# Patient Record
Sex: Female | Born: 1935
Health system: Southern US, Community
[De-identification: ages and names within clinical notes are randomized; demographics above are authoritative.]

## PROBLEM LIST (undated history)

## (undated) HISTORY — PX: ABDOMINAL HYSTERECTOMY: SHX81

## (undated) HISTORY — PX: BACK SURGERY: SHX140

---

## 1998-11-04 ENCOUNTER — Encounter: Admission: RE | Admit: 1998-11-04 | Discharge: 1999-02-02 | Payer: Self-pay | Admitting: Specialist

## 1999-02-19 ENCOUNTER — Other Ambulatory Visit: Admission: RE | Admit: 1999-02-19 | Discharge: 1999-02-19 | Payer: Self-pay | Admitting: Family Medicine

## 2000-02-27 ENCOUNTER — Encounter: Payer: Self-pay | Admitting: Family Medicine

## 2000-02-27 ENCOUNTER — Encounter: Admission: RE | Admit: 2000-02-27 | Discharge: 2000-02-27 | Payer: Self-pay | Admitting: Family Medicine

## 2000-06-11 ENCOUNTER — Emergency Department (HOSPITAL_COMMUNITY): Admission: EM | Admit: 2000-06-11 | Discharge: 2000-06-11 | Payer: Self-pay | Admitting: Emergency Medicine

## 2001-02-23 ENCOUNTER — Encounter: Payer: Self-pay | Admitting: Neurosurgery

## 2001-02-23 ENCOUNTER — Encounter: Admission: RE | Admit: 2001-02-23 | Discharge: 2001-02-23 | Payer: Self-pay | Admitting: Neurosurgery

## 2001-03-04 ENCOUNTER — Encounter: Payer: Self-pay | Admitting: Family Medicine

## 2001-03-04 ENCOUNTER — Encounter: Admission: RE | Admit: 2001-03-04 | Discharge: 2001-03-04 | Payer: Self-pay | Admitting: Family Medicine

## 2002-03-06 ENCOUNTER — Encounter: Admission: RE | Admit: 2002-03-06 | Discharge: 2002-03-06 | Payer: Self-pay | Admitting: Family Medicine

## 2002-03-06 ENCOUNTER — Encounter: Payer: Self-pay | Admitting: Family Medicine

## 2003-03-15 ENCOUNTER — Encounter: Payer: Self-pay | Admitting: Family Medicine

## 2003-03-15 ENCOUNTER — Encounter: Admission: RE | Admit: 2003-03-15 | Discharge: 2003-03-15 | Payer: Self-pay | Admitting: Family Medicine

## 2004-03-18 ENCOUNTER — Encounter: Admission: RE | Admit: 2004-03-18 | Discharge: 2004-03-18 | Payer: Self-pay | Admitting: Family Medicine

## 2005-03-19 ENCOUNTER — Encounter: Admission: RE | Admit: 2005-03-19 | Discharge: 2005-03-19 | Payer: Self-pay | Admitting: Family Medicine

## 2006-03-31 ENCOUNTER — Encounter: Admission: RE | Admit: 2006-03-31 | Discharge: 2006-03-31 | Payer: Self-pay | Admitting: Family Medicine

## 2007-04-04 ENCOUNTER — Encounter: Admission: RE | Admit: 2007-04-04 | Discharge: 2007-04-04 | Payer: Self-pay | Admitting: Family Medicine

## 2007-12-27 ENCOUNTER — Ambulatory Visit: Payer: Self-pay | Admitting: Family Medicine

## 2007-12-27 ENCOUNTER — Encounter: Admission: RE | Admit: 2007-12-27 | Discharge: 2007-12-27 | Payer: Self-pay | Admitting: Family Medicine

## 2007-12-30 ENCOUNTER — Ambulatory Visit: Payer: Self-pay | Admitting: Family Medicine

## 2008-01-13 ENCOUNTER — Ambulatory Visit: Payer: Self-pay | Admitting: Family Medicine

## 2008-01-14 ENCOUNTER — Ambulatory Visit (HOSPITAL_COMMUNITY): Admission: RE | Admit: 2008-01-14 | Discharge: 2008-01-14 | Payer: Self-pay | Admitting: Family Medicine

## 2008-04-04 ENCOUNTER — Encounter: Admission: RE | Admit: 2008-04-04 | Discharge: 2008-04-04 | Payer: Self-pay | Admitting: Family Medicine

## 2009-06-19 ENCOUNTER — Encounter: Admission: RE | Admit: 2009-06-19 | Discharge: 2009-06-19 | Payer: Self-pay | Admitting: Orthopaedic Surgery

## 2010-07-12 ENCOUNTER — Observation Stay (HOSPITAL_COMMUNITY): Admission: EM | Admit: 2010-07-12 | Discharge: 2010-07-13 | Payer: Self-pay | Admitting: Emergency Medicine

## 2010-07-29 ENCOUNTER — Ambulatory Visit: Payer: Self-pay | Admitting: Family Medicine

## 2011-01-08 LAB — RAPID URINE DRUG SCREEN, HOSP PERFORMED
Amphetamines: NOT DETECTED
Barbiturates: NOT DETECTED
Benzodiazepines: NOT DETECTED
Cocaine: NOT DETECTED
Opiates: NOT DETECTED
Tetrahydrocannabinol: NOT DETECTED

## 2011-01-08 LAB — COMPREHENSIVE METABOLIC PANEL
ALT: 17 U/L (ref 0–35)
AST: 23 U/L (ref 0–37)
Albumin: 4 g/dL (ref 3.5–5.2)
Alkaline Phosphatase: 84 U/L (ref 39–117)
BUN: 8 mg/dL (ref 6–23)
CO2: 26 mEq/L (ref 19–32)
Calcium: 9.4 mg/dL (ref 8.4–10.5)
Chloride: 109 mEq/L (ref 96–112)
Creatinine, Ser: 0.91 mg/dL (ref 0.4–1.2)
GFR calc Af Amer: >60 mL/min (ref >60)
GFR calc non Af Amer: >60 mL/min (ref >60)
Glucose, Bld: 95 mg/dL (ref 70–99)
Potassium: 4 mEq/L (ref 3.5–5.1)
Sodium: 140 mEq/L (ref 135–145)
Total Bilirubin: 0.4 mg/dL (ref 0.3–1.2)
Total Protein: 7.5 g/dL (ref 6.0–8.3)

## 2011-01-08 LAB — URINALYSIS, ROUTINE W REFLEX MICROSCOPIC
Bilirubin Urine: NEGATIVE
Glucose, UA: NEGATIVE mg/dL
Hgb urine dipstick: NEGATIVE
Ketones, ur: NEGATIVE mg/dL
Nitrite: NEGATIVE
Protein, ur: NEGATIVE mg/dL
Specific Gravity, Urine: 1.005 (ref 1.005–1.030)
Urobilinogen, UA: 0.2 mg/dL (ref 0.0–1.0)
pH: 7 (ref 5.0–8.0)

## 2011-01-08 LAB — CBC
HCT: 39.5 % (ref 36.0–46.0)
Hemoglobin: 13 g/dL (ref 12.0–15.0)
MCH: 27.5 pg (ref 26.0–34.0)
MCHC: 32.9 g/dL (ref 30.0–36.0)
MCV: 83.5 fL (ref 78.0–100.0)
Platelets: 261 10*3/uL (ref 150–400)
RBC: 4.73 MIL/uL (ref 3.87–5.11)
RDW: 14.8 % (ref 11.5–15.5)
WBC: 6.1 10*3/uL (ref 4.0–10.5)

## 2011-01-08 LAB — POCT CARDIAC MARKERS
CKMB, poc: <1 ng/mL — ABNORMAL LOW (ref 1.0–8.0)
Myoglobin, poc: 68.9 ng/mL (ref 12–200)
Troponin i, poc: <0.05 ng/mL (ref 0.00–0.09)

## 2011-01-08 LAB — DIFFERENTIAL
Basophils Absolute: 0.1 10*3/uL (ref 0.0–0.1)
Basophils Relative: 1 % (ref 0–1)
Eosinophils Absolute: 0.3 10*3/uL (ref 0.0–0.7)
Eosinophils Relative: 5 % (ref 0–5)
Lymphocytes Relative: 23 % (ref 12–46)
Lymphs Abs: 1.4 10*3/uL (ref 0.7–4.0)
Monocytes Absolute: 0.8 10*3/uL (ref 0.1–1.0)
Monocytes Relative: 13 % — ABNORMAL HIGH (ref 3–12)
Neutro Abs: 3.6 10*3/uL (ref 1.7–7.7)
Neutrophils Relative %: 58 % (ref 43–77)

## 2011-01-08 LAB — ETHANOL: Alcohol, Ethyl (B): <5 mg/dL (ref 0–10)

## 2011-02-16 ENCOUNTER — Other Ambulatory Visit: Payer: Self-pay | Admitting: Neurosurgery

## 2011-02-16 DIAGNOSIS — M79603 Pain in arm, unspecified: Secondary | ICD-10-CM

## 2011-02-16 DIAGNOSIS — S143XXA Injury of brachial plexus, initial encounter: Secondary | ICD-10-CM

## 2011-02-19 ENCOUNTER — Ambulatory Visit
Admission: RE | Admit: 2011-02-19 | Discharge: 2011-02-19 | Disposition: A | Discharge status: Home / Self Care | Payer: Medicare Other | Source: Ambulatory Visit | Attending: Neurosurgery | Admitting: Neurosurgery

## 2011-02-19 DIAGNOSIS — S143XXA Injury of brachial plexus, initial encounter: Secondary | ICD-10-CM

## 2011-02-19 DIAGNOSIS — M79603 Pain in arm, unspecified: Secondary | ICD-10-CM

## 2011-02-19 MED ORDER — GADOBENATE DIMEGLUMINE 529 MG/ML IV SOLN
16.0000 mL | Freq: Once | INTRAVENOUS | Status: AC | PRN
  Administered 2011-02-19: 16 mL via INTRAVENOUS

## 2012-01-18 ENCOUNTER — Ambulatory Visit (INDEPENDENT_AMBULATORY_CARE_PROVIDER_SITE_OTHER): Payer: Medicare Other | Admitting: Family Medicine

## 2012-01-18 ENCOUNTER — Encounter: Payer: Self-pay | Admitting: Family Medicine

## 2012-01-18 VITALS — BP 120/78 | HR 87 | Ht 65.0 in | Wt 184.0 lb

## 2012-01-18 DIAGNOSIS — Z79899 Other long term (current) drug therapy: Secondary | ICD-10-CM

## 2012-01-18 DIAGNOSIS — Z23 Encounter for immunization: Secondary | ICD-10-CM

## 2012-01-18 DIAGNOSIS — F439 Reaction to severe stress, unspecified: Secondary | ICD-10-CM

## 2012-01-18 DIAGNOSIS — Z299 Encounter for prophylactic measures, unspecified: Secondary | ICD-10-CM

## 2012-01-18 DIAGNOSIS — Z639 Problem related to primary support group, unspecified: Secondary | ICD-10-CM

## 2012-01-18 DIAGNOSIS — Z1231 Encounter for screening mammogram for malignant neoplasm of breast: Secondary | ICD-10-CM

## 2012-01-18 NOTE — Progress Notes (Signed)
  Subjective:    Patient ID: Katherine Mccormick, female    DOB: [birth year], 78 y.o.   MRN: [id number]  HPI She is here for consultation. Her main concern today is the stress that she is under taking care of her husband. He does have diabetes and has had extensive difficulty with this including partial amputation of the leg. Apparently he is relying upon her for taking care of all of his needs. This has gotten her quite upset and to a certain extent resentful. Presently she is on no medicines. Review of her record indicates she does need routine medical followup however she is really not necessarily interested in having this.   Review of Systems     Objective:   Physical Exam alert and in no distress. Tympanic membranes and canals are normal. Throat is clear. Tonsils are normal. Neck is supple without adenopathy or thyromegaly. Cardiac exam shows a regular sinus rhythm without murmurs or gallops. Lungs are clear to auscultation.       Assessment & Plan:   1. Encounter for long-term (current) use of other medications  DG Bone Density  2. Preventive measure  MM Digital Screening  3. Stress At Home     I did talk her into bone density scan and mammogram. Discussed getting counseling or even having home health come in. She would like to discuss this further with her children. Over 30 minutes spent discussing all these issues with her.

## 2012-01-28 ENCOUNTER — Ambulatory Visit
Admission: RE | Admit: 2012-01-28 | Discharge: 2012-01-28 | Disposition: A | Discharge status: Home / Self Care | Payer: Medicare Other | Source: Ambulatory Visit | Attending: Family Medicine | Admitting: Family Medicine

## 2012-01-28 DIAGNOSIS — Z299 Encounter for prophylactic measures, unspecified: Secondary | ICD-10-CM

## 2012-02-05 ENCOUNTER — Encounter: Payer: Self-pay | Admitting: Family Medicine

## 2012-02-21 ENCOUNTER — Observation Stay (HOSPITAL_COMMUNITY)
Admission: EM | Admit: 2012-02-21 | Discharge: 2012-02-22 | Disposition: A | Discharge status: Home / Self Care | Payer: No Typology Code available for payment source | Attending: Internal Medicine | Admitting: Internal Medicine

## 2012-02-21 ENCOUNTER — Emergency Department (HOSPITAL_COMMUNITY): Payer: No Typology Code available for payment source

## 2012-02-21 ENCOUNTER — Encounter (HOSPITAL_COMMUNITY): Payer: Self-pay | Admitting: Emergency Medicine

## 2012-02-21 DIAGNOSIS — S060XAA Concussion with loss of consciousness status unknown, initial encounter: Secondary | ICD-10-CM | POA: Insufficient documentation

## 2012-02-21 DIAGNOSIS — R55 Syncope and collapse: Principal | ICD-10-CM

## 2012-02-21 DIAGNOSIS — M25571 Pain in right ankle and joints of right foot: Secondary | ICD-10-CM

## 2012-02-21 DIAGNOSIS — S9000XA Contusion of unspecified ankle, initial encounter: Secondary | ICD-10-CM | POA: Insufficient documentation

## 2012-02-21 DIAGNOSIS — S93409A Sprain of unspecified ligament of unspecified ankle, initial encounter: Secondary | ICD-10-CM | POA: Insufficient documentation

## 2012-02-21 DIAGNOSIS — S060X9A Concussion with loss of consciousness of unspecified duration, initial encounter: Secondary | ICD-10-CM | POA: Insufficient documentation

## 2012-02-21 LAB — TROPONIN I: Troponin I: <0.3 ng/mL (ref <0.30)

## 2012-02-21 LAB — CBC
HCT: 43.1 % (ref 36.0–46.0)
Hemoglobin: 14.5 g/dL (ref 12.0–15.0)
MCH: 28 pg (ref 26.0–34.0)
MCHC: 33.6 g/dL (ref 30.0–36.0)
MCV: 83.4 fL (ref 78.0–100.0)
Platelets: 251 10*3/uL (ref 150–400)
RBC: 5.17 MIL/uL — ABNORMAL HIGH (ref 3.87–5.11)
RDW: 14.7 % (ref 11.5–15.5)
WBC: 8.3 10*3/uL (ref 4.0–10.5)

## 2012-02-21 LAB — BASIC METABOLIC PANEL
BUN: 14 mg/dL (ref 6–23)
CO2: 28 mEq/L (ref 19–32)
Calcium: 10.2 mg/dL (ref 8.4–10.5)
Chloride: 103 mEq/L (ref 96–112)
Creatinine, Ser: 0.9 mg/dL (ref 0.50–1.10)
GFR calc Af Amer: 71 mL/min — ABNORMAL LOW (ref >90)
GFR calc non Af Amer: 61 mL/min — ABNORMAL LOW (ref >90)
Glucose, Bld: 122 mg/dL — ABNORMAL HIGH (ref 70–99)
Potassium: 3.7 mEq/L (ref 3.5–5.1)
Sodium: 143 mEq/L (ref 135–145)

## 2012-02-21 LAB — CARDIAC PANEL(CRET KIN+CKTOT+MB+TROPI)
CK, MB: 2.5 ng/mL (ref 0.3–4.0)
Relative Index: 1.2 (ref 0.0–2.5)
Total CK: 212 U/L — ABNORMAL HIGH (ref 7–177)
Troponin I: <0.3 ng/mL (ref <0.30)

## 2012-02-21 LAB — DIFFERENTIAL
Basophils Absolute: 0.1 10*3/uL (ref 0.0–0.1)
Basophils Relative: 1 % (ref 0–1)
Eosinophils Absolute: 0.1 10*3/uL (ref 0.0–0.7)
Eosinophils Relative: 1 % (ref 0–5)
Lymphocytes Relative: 15 % (ref 12–46)
Lymphs Abs: 1.3 10*3/uL (ref 0.7–4.0)
Monocytes Absolute: 0.7 10*3/uL (ref 0.1–1.0)
Monocytes Relative: 8 % (ref 3–12)
Neutro Abs: 6.2 10*3/uL (ref 1.7–7.7)
Neutrophils Relative %: 75 % (ref 43–77)

## 2012-02-21 MED ORDER — DICLOFENAC SODIUM 1 % TD GEL
Freq: Four times a day (QID) | TRANSDERMAL | Status: DC
  Administered 2012-02-22 (×2): via TOPICAL
  Filled 2012-02-21 (×3): qty 100

## 2012-02-21 MED ORDER — SODIUM CHLORIDE 0.9 % IV SOLN
INTRAVENOUS | Status: DC
  Administered 2012-02-21: 21:00:00 via INTRAVENOUS

## 2012-02-21 MED ORDER — ACETAMINOPHEN 650 MG RE SUPP: 650.0000 mg | Freq: Four times a day (QID) | RECTAL | Status: DC | PRN

## 2012-02-21 MED ORDER — ACETAMINOPHEN 325 MG PO TABS: 650.0000 mg | ORAL_TABLET | Freq: Four times a day (QID) | ORAL | Status: DC | PRN

## 2012-02-21 MED ORDER — ONDANSETRON HCL 4 MG PO TABS: 4.0000 mg | ORAL_TABLET | Freq: Four times a day (QID) | ORAL | Status: DC | PRN

## 2012-02-21 MED ORDER — ASPIRIN 325 MG PO TABS
325.0000 mg | ORAL_TABLET | Freq: Every day | ORAL | Status: DC | PRN
  Filled 2012-02-21: qty 1

## 2012-02-21 MED ORDER — ONDANSETRON HCL 4 MG/2ML IJ SOLN: 4.0000 mg | Freq: Four times a day (QID) | INTRAMUSCULAR | Status: DC | PRN

## 2012-02-21 MED ORDER — VITAMIN C 500 MG PO TABS
500.0000 mg | ORAL_TABLET | Freq: Every day | ORAL | Status: DC
  Administered 2012-02-22: 500 mg via ORAL
  Filled 2012-02-21 (×2): qty 1
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
## 2012-02-21 NOTE — ED Notes (Signed)
Report given to Melissa H., RN.
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
## 2012-02-22 DIAGNOSIS — R55 Syncope and collapse: Secondary | ICD-10-CM

## 2012-02-22 LAB — CARDIAC PANEL(CRET KIN+CKTOT+MB+TROPI)
CK, MB: 2.2 ng/mL (ref 0.3–4.0)
CK, MB: 1.9 ng/mL (ref 0.3–4.0)
Relative Index: 1.2 (ref 0.0–2.5)
Relative Index: 1.1 (ref 0.0–2.5)
Total CK: 180 U/L — ABNORMAL HIGH (ref 7–177)
Total CK: 166 U/L (ref 7–177)
Troponin I: <0.3 ng/mL (ref <0.30)
Troponin I: <0.3 ng/mL (ref <0.30)

## 2012-02-22 LAB — BASIC METABOLIC PANEL
BUN: 14 mg/dL (ref 6–23)
CO2: 24 mEq/L (ref 19–32)
Calcium: 9.5 mg/dL (ref 8.4–10.5)
Chloride: 108 mEq/L (ref 96–112)
Creatinine, Ser: 0.86 mg/dL (ref 0.50–1.10)
GFR calc Af Amer: 75 mL/min — ABNORMAL LOW (ref >90)
GFR calc non Af Amer: 64 mL/min — ABNORMAL LOW (ref >90)
Glucose, Bld: 100 mg/dL — ABNORMAL HIGH (ref 70–99)
Potassium: 3.8 mEq/L (ref 3.5–5.1)
Sodium: 144 mEq/L (ref 135–145)

## 2012-02-22 MED ORDER — ACETAMINOPHEN 325 MG PO TABS: 650.0000 mg | ORAL_TABLET | Freq: Four times a day (QID) | ORAL | Status: AC | PRN

## 2012-02-22 MED ORDER — DICLOFENAC SODIUM 1 % TD GEL: 1.0000 "application " | Freq: Four times a day (QID) | TRANSDERMAL | Status: DC
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
## 2012-02-22 NOTE — Progress Notes (Signed)
Bilateral carotid artery duplex completed at 09:12.  Preliminary report is no evidence of significant ICA stenosis.

## 2012-02-22 NOTE — Discharge Instructions (Signed)
Apply ice every 2 to 3 hour to ankle for 24 hour more.  If headache get worse return to hospital.  Please follow with PCP in 2 days.  Do not drive.

## 2012-02-22 NOTE — Progress Notes (Signed)
*  PRELIMINARY RESULTS* Echocardiogram 2D Echocardiogram has been performed.  Katherine Mccormick 02/22/2012, 12:23 PM
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
## 2012-02-25 ENCOUNTER — Ambulatory Visit (INDEPENDENT_AMBULATORY_CARE_PROVIDER_SITE_OTHER): Payer: Medicare Other | Admitting: Family Medicine

## 2012-02-25 ENCOUNTER — Encounter: Payer: Self-pay | Admitting: Family Medicine

## 2012-02-25 DIAGNOSIS — S9000XA Contusion of unspecified ankle, initial encounter: Secondary | ICD-10-CM

## 2012-02-25 DIAGNOSIS — S9001XA Contusion of right ankle, initial encounter: Secondary | ICD-10-CM
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
## 2012-02-25 NOTE — Patient Instructions (Signed)
Use the wrap as needed for comfort. You can take it off whenever you want. Tylenol for the aches and pains time will cure more than I would ever hope to!

## 2012-12-20 ENCOUNTER — Other Ambulatory Visit: Payer: Self-pay | Admitting: Family Medicine

## 2012-12-20 DIAGNOSIS — Z1231 Encounter for screening mammogram for malignant neoplasm of breast: Secondary | ICD-10-CM

## 2013-01-30 ENCOUNTER — Ambulatory Visit
Admission: RE | Admit: 2013-01-30 | Discharge: 2013-01-30 | Disposition: A | Discharge status: Home / Self Care | Payer: Medicare Other | Source: Ambulatory Visit | Attending: Family Medicine | Admitting: Family Medicine

## 2013-01-30 DIAGNOSIS — Z1231 Encounter for screening mammogram for malignant neoplasm of breast: Secondary | ICD-10-CM

## 2013-05-04 ENCOUNTER — Encounter: Payer: Self-pay | Admitting: Family Medicine

## 2013-05-04 ENCOUNTER — Ambulatory Visit (INDEPENDENT_AMBULATORY_CARE_PROVIDER_SITE_OTHER): Payer: Medicare Other | Admitting: Family Medicine

## 2013-05-04 VITALS — BP 120/80 | HR 77 | Temp 98.4°F | Wt 177.0 lb

## 2013-05-04 DIAGNOSIS — R109 Unspecified abdominal pain: Secondary | ICD-10-CM

## 2013-05-04 NOTE — Progress Notes (Signed)
  Subjective:    Patient ID: Katherine Mccormick, female    DOB: [birth year], 79 y.o.   MRN: [id number]  HPI She has a 2 to three-week history of difficulty with abdominal distress. She notes that getting upset does make it worse. She does complain of a cramping sensation. No nausea, vomiting, diarrhea. She states that food does make it feel better. No bowel habit changes or black, tarry stools   Review of Systems     Objective:   Physical Exam Alert and in no distress. Abdominal exam shows active bowel sounds without masses or tenderness       Assessment & Plan:  Abdominal  pain, other specified site  I explained that I did not think this was necessarily an ulcer but did have a psychological component to this as well. I will try her on Prilosec for the next 10 days or double dosing and she will then call me.

## 2013-05-04 NOTE — Patient Instructions (Signed)
Take 2 Prilosec per day for the next 7-10 days and let me know how you're doing
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
## 2014-03-13 ENCOUNTER — Other Ambulatory Visit: Payer: Self-pay

## 2014-03-13 DIAGNOSIS — Z1231 Encounter for screening mammogram for malignant neoplasm of breast: Secondary | ICD-10-CM

## 2014-04-03 ENCOUNTER — Encounter (INDEPENDENT_AMBULATORY_CARE_PROVIDER_SITE_OTHER): Payer: Self-pay

## 2014-04-03 ENCOUNTER — Ambulatory Visit
Admission: RE | Admit: 2014-04-03 | Discharge: 2014-04-03 | Disposition: A | Discharge status: Home / Self Care | Payer: Medicare Other | Source: Ambulatory Visit

## 2014-04-03 DIAGNOSIS — Z1231 Encounter for screening mammogram for malignant neoplasm of breast: Secondary | ICD-10-CM

## 2014-07-04 ENCOUNTER — Ambulatory Visit (INDEPENDENT_AMBULATORY_CARE_PROVIDER_SITE_OTHER): Payer: Medicare Other | Admitting: Family Medicine

## 2014-07-04 ENCOUNTER — Encounter: Payer: Self-pay | Admitting: Family Medicine

## 2014-07-04 VITALS — BP 124/82 | HR 89 | Wt 183.0 lb

## 2014-07-04 DIAGNOSIS — M79605 Pain in left leg: Secondary | ICD-10-CM

## 2014-07-04 DIAGNOSIS — M79609 Pain in unspecified limb: Secondary | ICD-10-CM

## 2014-07-04 NOTE — Progress Notes (Signed)
   Subjective:    Patient ID: Katherine Mccormick, female    DOB: 1934-02-13, 80 y.o.   MRN: [id number]  HPI She has a ten-day history of left lateral calf pain standing and walking make it worse. No history of injury or overuse. The pain starts in the lower extremity and then migrates proximal with standing and walking . No numbness, tingling or weakness  Review of Systems     Objective:   Physical Exam Full motion of the hip knees and ankles. Pulses are good. Slight tenderness to palpation over the anterior compartment. DTRs normal. Negative Homan's sign. No edema noted.      Assessment & Plan:  Leg pain, lateral, left  recommend conservative care with heat and anti-inflammatory. Return here symptoms continue.

## 2014-07-04 NOTE — Patient Instructions (Signed)
Heat for 20 minutes 3 times per day. 2 Aleve twice a day. Do this for about 10 days and let see how you

## 2014-08-08 ENCOUNTER — Encounter: Payer: Self-pay | Admitting: Family Medicine

## 2014-08-08 ENCOUNTER — Ambulatory Visit (INDEPENDENT_AMBULATORY_CARE_PROVIDER_SITE_OTHER): Payer: Medicare Other | Admitting: Family Medicine

## 2014-08-08 VITALS — BP 122/78 | HR 71 | Temp 97.9°F | Wt 180.0 lb

## 2014-08-08 DIAGNOSIS — M79662 Pain in left lower leg: Secondary | ICD-10-CM

## 2014-08-08 NOTE — Progress Notes (Signed)
   Subjective:    Patient ID: Katherine Mccormick, female    DOB: [birth year], 80 y.o.   MRN: [id number]  HPI She is here for evaluation of continued with left lateral calf pain. It is now constant and does get worse with any activity. She has no previous history of injury or overuse. No other bone or joint pain is noted. She's had no chest pain or shortness of breath, fever, chills.   Review of Systems     Objective:   Physical Exam Active motion of the ankle and knee cause discomfort. Passive motion of knee and ankle was negative. Tender to palpation to the lateral calf area. No palpable lesions were noted. Homans sign was negative. Skin appears normal.       Assessment & Plan:  Calf pain, left - Plan: DG Tibia/Fibula Left  if the x-ray is negative, I will send for ultrasound

## 2014-08-09 ENCOUNTER — Ambulatory Visit
Admission: RE | Admit: 2014-08-09 | Discharge: 2014-08-09 | Disposition: A | Discharge status: Home / Self Care | Payer: Medicare Other | Source: Ambulatory Visit | Attending: Family Medicine | Admitting: Family Medicine

## 2014-08-09 ENCOUNTER — Encounter (INDEPENDENT_AMBULATORY_CARE_PROVIDER_SITE_OTHER): Payer: Self-pay

## 2014-08-09 DIAGNOSIS — M79662 Pain in left lower leg: Secondary | ICD-10-CM

## 2014-08-14 ENCOUNTER — Encounter: Payer: Self-pay | Admitting: Family Medicine

## 2014-08-14 ENCOUNTER — Ambulatory Visit (INDEPENDENT_AMBULATORY_CARE_PROVIDER_SITE_OTHER): Payer: Medicare Other | Admitting: Family Medicine

## 2014-08-14 ENCOUNTER — Other Ambulatory Visit (INDEPENDENT_AMBULATORY_CARE_PROVIDER_SITE_OTHER): Payer: Medicare Other

## 2014-08-14 VITALS — BP 140/82 | HR 96 | Ht 65.5 in | Wt 183.0 lb

## 2014-08-14 DIAGNOSIS — M79605 Pain in left leg: Secondary | ICD-10-CM

## 2014-08-14 DIAGNOSIS — G5782 Other specified mononeuropathies of left lower limb: Secondary | ICD-10-CM

## 2014-08-14 MED ORDER — GABAPENTIN 100 MG PO CAPS
100.0000 mg | ORAL_CAPSULE | Freq: Every day | ORAL | Status: DC
Start: 2014-08-14 — End: 2017-08-19

## 2014-08-14 NOTE — Progress Notes (Signed)
Katherine ScaleZach Tamlyn Mccormick D.O. [facility] [address] [place], [place] [address] Phone: [phone] Subjective:    I'm seeing this patient by the request  of:  Katherine HerterLALONDE,JOHN CHARLES, MD   CC:  Left leg pain  HQI:[id number]:Subjective Katherine SakaiHelen G Mccormick is a 80 y.o. female coming in with complaint of left calf pain. Patient has been seen by primary care provider on 2 occasions over the course of the last 6 weeks for this pain. Patient states that the pain is worse when she is standing or walking for long amount of time. Denies any pain with sitting. Does not remember any true injury. Points the pain mostly over the lateral distal aspect of the calf muscle as well as the fibula bone itself. Pain going upper calf. Patient states that she does a significant amount walking he can even hurt on her lateral hip. Patient does have a past medical history significant for arthroscopic surgery of the knee but states that she does not have any knee pain or swelling a regular basis. Patient states that this pain can be more of a burning sensation as well and give her some numbness in her large toe from time to time. Rates the severity of 7/10. States that he can keep her up at night but does not wake her up at night.  Patient was sent for x-rays of the tibia-fibula which were unremarkable. These were reviewed by me today. Patient does have moderate to severe osteophytic changes of the knee though noted.       Past medical history, social, surgical and family history all reviewed in electronic medical record.   Review of Systems: No headache, visual changes, nausea, vomiting, diarrhea, constipation, dizziness, abdominal pain, skin rash, fevers, chills, night sweats, weight loss, swollen lymph nodes, body aches, joint swelling, muscle aches, chest pain, shortness of breath, mood changes.   Objective Blood pressure 140/82, pulse 96, height 5' 5.5" (1.664 m), weight 183 lb (83.008 kg), SpO2 96.00%.  General: No apparent  distress alert and oriented x3 mood and affect normal, dressed appropriately.  HEENT: Pupils equal, extraocular movements intact  Respiratory: Patient's speak in full sentences and does not appear short of breath  Cardiovascular: No lower extremity edema, non tender, no erythema  Skin: Warm dry intact with no signs of infection or rash on extremities or on axial skeleton.  Abdomen: Soft nontender  Neuro: Cranial nerves II through XII are intact, neurovascularly intact in all extremities with 2+ DTRs and 2+ pulses.  Lymph: No lymphadenopathy of posterior or anterior cervical chain or axillae bilaterally.  Gait mild antalgic gait and walks with a cane.  MSK:  Non tender with full range of motion and good stability and symmetric strength and tone of shoulders, elbows, wrist, hip and ankles bilaterally.   Knee: Left Nontender on exam the patient does have moderate osteophytic changes Patient though does have tenderness mostly over the lateral aspect the leg and seems to be superficial to the fibular bone on the proximal ankle area. Patient does have overlying dry skin in this area. ROM full in flexion and extension and lower leg rotation. Ligaments with solid consistent endpoints including ACL, PCL, LCL, MCL. Negative Mcmurray's, Apley's, and Thessalonian tests. Mild painful patellar compression. Patellar glide without crepitus. Patellar and quadriceps tendons unremarkable. Hamstring and quadriceps strength is normal.   MSK US performed of: Left calf This study was ordered, performed, and interpreted by Terrilee FilesZach Jodel Mayhall D.O.   Limited study shows the patient does have a  proximal strain with scarring over the insertion of the gastrocnemius on the lateral femoral condyle. Patient is minimal tender in this area. Popliteus muscle is intact and sitting within the groove with no signs of subluxation. In area where patient is most tender over the lateral aspect patient does have significant enlargement of  the sural nerve patient is tender to compression. Increasing Doppler flow noted.  IMPRESSION: Sural neuritis and questionable old calf tear      Impression and Recommendations:     This case required medical decision making of moderate complexity.

## 2014-08-14 NOTE — Patient Instructions (Addendum)
Good to meet you I see that your sural nerve is irritated.  Wear the ace wrap daily to give padding.  Don't sleep in it.  Ice 10 minutes 2 times daily can help Eucerin or aveeno cream 3 times daily to dry skin.  Gabapentin 100mg  at night. This will decrease the irritation Can continue the voltaren gel.  Vitamin D 2000 IU daily.  Exercises 3 times a week.  See me again in 2-3 weeks and bring some one with you if we decide to inject the nerve.

## 2014-08-14 NOTE — Assessment & Plan Note (Signed)
Discussed with patient at great length. We did discuss the significant difference treatment options. Patient was given a compression dressing today that did helped out significantly. We discussed avoiding direct contact in this area. Patient will also try to help with the dry skin over the course of time here. We discussed also continuing the Voltaren gel she was given by PCP.  We discussed an icing protocol and patient was given some exercises that I think will be beneficial. Patient will start gabapentin 100 mg at night to try to help her with sleep and try to decrease the sensation of this nerve. Patient will come back again in 2-3 weeks for further evaluation. If continuing to have discomfort we can consider a intraneuronal sheath injection the patient would need to bring someone in case foot drop that did occur for short amount of time. Hopefully that this will be needed. Differential also includes referred pain from her osteophytic knee but will discuss further at followup.

## 2014-08-28 ENCOUNTER — Ambulatory Visit: Payer: Medicare Other | Admitting: Family Medicine

## 2014-08-29 ENCOUNTER — Ambulatory Visit (INDEPENDENT_AMBULATORY_CARE_PROVIDER_SITE_OTHER): Payer: Medicare Other | Admitting: Family Medicine

## 2014-08-29 ENCOUNTER — Encounter: Payer: Self-pay | Admitting: Family Medicine

## 2014-08-29 VITALS — BP 130/82 | HR 83 | Ht 65.0 in | Wt 186.0 lb

## 2014-08-29 DIAGNOSIS — G5782 Other specified mononeuropathies of left lower limb: Secondary | ICD-10-CM

## 2014-08-29 NOTE — Patient Instructions (Signed)
Good to see you Give yourself a pat on the back.  Continue the exercises 3 times a week for another 6 weeks.  Continue the compression when you need it.  Turmeric either with food or 500mg  twice daily can help See me again in 4 weeks if any pain.

## 2014-08-29 NOTE — Progress Notes (Signed)
  Tawana ScaleZach Smith D.O. [facility] [address] [place], [place] [address] Phone: [phone]([phone] Subjective:    I'm seeing this patient by the request  of:  Carollee HerterLALONDE,JOHN CHARLES, MD   CC:  Left leg pain  MWN:[id number]:Subjective Lamonte SakaiHelen G Sauseda is a 80 y.o. female coming in with complaint of left calf pain. Patient was seen previously 3 weeks ago and was diagnosed with a sural neuritis.  Patient was elected to do conservative therapy. Patient was started on gabapentin 100 mg at night and states that she has been taking his fairly religiously. Patient is having minimal pain at this time. Patient is doing the exercises as well as the icing. Patient was given a trial topical anti-inflammatories which she says she does not even need anymore at this time. Patient has been able to walk significantly better she states. States that it feels like she is stronger. Patient would put herself as 90% better.       Past medical history, social, surgical and family history all reviewed in electronic medical record.   Review of Systems: No headache, visual changes, nausea, vomiting, diarrhea, constipation, dizziness, abdominal pain, skin rash, fevers, chills, night sweats, weight loss, swollen lymph nodes, body aches, joint swelling, muscle aches, chest pain, shortness of breath, mood changes.   Objective Blood pressure 130/82, pulse 83, height 5\' 5"  (1.651 m), weight 186 lb (84.369 kg), SpO2 98 %.  General: No apparent distress alert and oriented x3 mood and affect normal, dressed appropriately.  HEENT: Pupils equal, extraocular movements intact  Respiratory: Patient's speak in full sentences and does not appear short of breath  Cardiovascular: No lower extremity edema, non tender, no erythema  Skin: Warm dry intact with no signs of infection or rash on extremities or on axial skeleton.  Abdomen: Soft nontender  Neuro: Cranial nerves II through XII are intact, neurovascularly intact in all extremities with 2+  DTRs and 2+ pulses.  Lymph: No lymphadenopathy of posterior or anterior cervical chain or axillae bilaterally.  Gait mild antalgic gait and walks with a cane.  MSK:  Non tender with full range of motion and good stability and symmetric strength and tone of shoulders, elbows, wrist, hip and ankles bilaterally.   Knee: Left Nontender on exam the patient does have moderate osteophytic changes Improve tenderness in the proximal ankle. Improved strength as well. ROM full in flexion and extension and lower leg rotation. Ligaments with solid consistent endpoints including ACL, PCL, LCL, MCL. Negative Mcmurray's, Apley's, and Thessalonian tests. Mild painful patellar compression. Patellar glide without crepitus. Patellar and quadriceps tendons unremarkable. Hamstring and quadriceps strength is normal.       Impression and Recommendations:     This case required medical decision making of moderate complexity.

## 2014-08-29 NOTE — Assessment & Plan Note (Signed)
Patient is doing remarkably well and no intervention or changes in management is necessary at this time. Encourage patient to continue the exercises fairly regularly to work on Print production plannertrengthening. We discussed topical anti-inflammatory on an as-needed basis. We will not titrate up on the gabapentin. Patient will make an appointment with me in 4 weeks in case of the pain returns. If patient does have worsening pain I would still consider a intraneuronal sheath injection if necessary but I am optimistic that this will not be needed.

## 2014-09-26 ENCOUNTER — Ambulatory Visit (INDEPENDENT_AMBULATORY_CARE_PROVIDER_SITE_OTHER): Payer: Medicare Other | Admitting: Family Medicine

## 2014-09-26 ENCOUNTER — Encounter: Payer: Self-pay | Admitting: Family Medicine

## 2014-09-26 VITALS — BP 138/86 | HR 81 | Wt 184.0 lb

## 2014-09-26 DIAGNOSIS — G5782 Other specified mononeuropathies of left lower limb: Secondary | ICD-10-CM

## 2014-09-26 NOTE — Progress Notes (Signed)
  Katherine Mccormick D.O. [facility] [address] [place], [place] [address] Phone: [phone] Subjective:     CC:  Left leg pain follow up  BJY:[id number]:Subjective Katherine SakaiHelen G Mccormick is a 80 y.o. female coming in with complaint of left calf pain. Patient was seen previously 3 weeks ago and was diagnosed with a sural neuritis.  Patient was elected to do conservative therapy. Patient was started on gabapentin 100 mg at night and continues to do the home exercises. Patient has no longer wearing the compression brace. Patient states that she is able to do daily activities relatively well but has days that are good and days that are bad. Patient continues to topical anti-inflammatory with mild to moderate benefit as well. Patient continues to ambulate with a cane. Patient denies any new weakness. States though that she made only approximately another 5% improvement from previous exam.       Past medical history, social, surgical and family history all reviewed in electronic medical record.   Review of Systems: No headache, visual changes, nausea, vomiting, diarrhea, constipation, dizziness, abdominal pain, skin rash, fevers, chills, night sweats, weight loss, swollen lymph nodes, body aches, joint swelling, muscle aches, chest pain, shortness of breath, mood changes.   Objective Blood pressure 138/86, pulse 81, weight 184 lb (83.462 kg), SpO2 98 %.  General: No apparent distress alert and oriented x3 mood and affect normal, dressed appropriately.  HEENT: Pupils equal, extraocular movements intact  Respiratory: Patient's speak in full sentences and does not appear short of breath  Cardiovascular: No lower extremity edema, non tender, no erythema  Skin: Warm dry intact with no signs of infection or rash on extremities or on axial skeleton.  Abdomen: Soft nontender  Neuro: Cranial nerves II through XII are intact, neurovascularly intact in all extremities with 2+ DTRs and 2+ pulses.  Lymph: No  lymphadenopathy of posterior or anterior cervical chain or axillae bilaterally.  Gait mild antalgic gait and walks with a cane.  MSK:  Non tender with full range of motion and good stability and symmetric strength and tone of shoulders, elbows, wrist, hip and ankles bilaterally.   Knee: Left Nontender today Improve tenderness in the proximal ankle. Improved strength as well. ROM full in flexion and extension and lower leg rotation. Ligaments with solid consistent endpoints including ACL, PCL, LCL, MCL. Negative Mcmurray's, Apley's, and Thessalonian tests. Mild painful patellar compression. Patellar glide without crepitus. Patellar and quadriceps tendons unremarkable. Hamstring and quadriceps strength is normal.  Continued discomfort more on the lateral aspect near the fibula to palpation.      Impression and Recommendations:     This case required medical decision making of moderate complexity.  Spent greater than 25 minutes with patient face-to-face and had greater than 50% of counseling including as described above in assessment and plan.

## 2014-09-26 NOTE — Patient Instructions (Signed)
Good to see you You are doing great!!! Continue everything you are doing.  Start the pennsaid twice daily. Pinkie amount. Lotion afterwards Come back in 2-4 weeks and if still in pain we can do injection, maybe bring someone with you.

## 2014-09-26 NOTE — Assessment & Plan Note (Signed)
Patient continues to have some neuronitis. We discussed icing protocol and continuing compression. Patient will continue on the gabapentin. Patient was given trial of a different anti-inflammatory. Patient will try these different changes and come back and see me in 3 weeks. If continuing to have pain we'll injection. Patient was told to bring someone in case there is any temporary foot drop. If patient does make improvement we will just continue with conservative therapy.

## 2015-03-27 ENCOUNTER — Encounter: Payer: Self-pay | Admitting: Medical

## 2015-03-27 ENCOUNTER — Ambulatory Visit (INDEPENDENT_AMBULATORY_CARE_PROVIDER_SITE_OTHER): Payer: Medicare Other | Admitting: Medical

## 2015-03-27 VITALS — BP 150/100 | HR 89 | Temp 98.1°F | Resp 14 | Wt 181.0 lb

## 2015-03-27 DIAGNOSIS — M79605 Pain in left leg: Secondary | ICD-10-CM

## 2015-03-27 DIAGNOSIS — M25512 Pain in left shoulder: Secondary | ICD-10-CM

## 2015-03-27 DIAGNOSIS — M79602 Pain in left arm: Secondary | ICD-10-CM

## 2015-03-27 MED ORDER — TRAMADOL HCL 50 MG PO TABS: 50.0000 mg | ORAL_TABLET | Freq: Four times a day (QID) | ORAL | Status: DC | PRN

## 2015-03-27 NOTE — Progress Notes (Signed)
Subjective: Here for left arm pain, can't get comfortable.  Starts in left shoulder and radiates down arm to fingers.  Been dealing with 2 days, couldn't sleep or get rest last night.   No prior similar.   Denies recent injury, fall.  Gets dizzy occasionally, but no ongoing issues with neck pain.  Exercises out in the yard doing yard work.  Took some aleve without benefit. No other aggravating or relieving factors. No other complaint.  Objective: BP 150/100 mmHg  Pulse 89  Temp(Src) 98.1 F (36.7 C) (Oral)  Resp 14  Wt 181 lb (82.101 kg)  Gen: wd, wn, nad Skin: unremarkable, no erythema or ecchymosis Neck: supple, nontender, somewhat decreased left neck rotation, otherwise ROM normal MSK: tender left arm in general, particular generalized shoulder region, AC joint, biceps tendon and left paraspinal upper back/trapezius and supraspinatus.  Pain in left shoulder generalized with ROM above 90 degrees.  Otherwise arm ROM normal, no deformity  Right arm unremarkable Ext: no edema Arms neurovascularly intact   Assessment: Encounter Diagnoses  Name Primary?  . Shoulder pain, acute, left Yes  . Arm pain, diffuse, left      Plan: Discussed her symptoms, possible causes.  Given no recent other ongoing issues, may just be shoulder bursitis and inflammation.  Can't rule out C spine issue such as stenosis of bulging disc.  I did review her 2011 MRI C spine showing some stenosis and foraminal bone spurring.  Gave the following recommendations.   Patient Instructions   Use OTC Aleve the next 5-7 days  Avoid lots of overhead motion with the left arm, and rest the left arm the next several day  Use the Ultram pain medication either at night, or up to every 8 hours if severe pain  Consider ice and heat alternating to left shoulder.  alternate frozen peas and warm towel, each for 15 minutes at a time  Do some gentle stretching of neck and shoulders  If not much improved within 2 weeks, then  recheck

## 2015-03-27 NOTE — Patient Instructions (Signed)
   Use OTC Aleve the next 5-7 days  Avoid lots of overhead motion with the left arm, and rest the left arm the next several day  Use the Ultram pain medication either at night, or up to every 8 hours if severe pain  Consider ice and heat alternating to left shoulder.  alternate frozen peas and warm towel, each for 15 minutes at a time  Do some gentle stretching of neck and shoulders  If not much improved within 2 weeks, then recheck

## 2015-03-29 ENCOUNTER — Ambulatory Visit: Payer: Self-pay | Admitting: Family Medicine

## 2015-08-29 DIAGNOSIS — H2512 Age-related nuclear cataract, left eye: Secondary | ICD-10-CM | POA: Diagnosis not present

## 2015-08-29 DIAGNOSIS — Z961 Presence of intraocular lens: Secondary | ICD-10-CM | POA: Diagnosis not present

## 2015-08-29 DIAGNOSIS — H25012 Cortical age-related cataract, left eye: Secondary | ICD-10-CM | POA: Diagnosis not present

## 2015-12-30 ENCOUNTER — Telehealth: Payer: Self-pay | Admitting: Family Medicine

## 2015-12-30 ENCOUNTER — Ambulatory Visit
Admission: RE | Admit: 2015-12-30 | Discharge: 2015-12-30 | Disposition: A | Discharge status: Home / Self Care | Payer: Self-pay | Source: Ambulatory Visit | Attending: Family Medicine | Admitting: Family Medicine

## 2015-12-30 ENCOUNTER — Other Ambulatory Visit: Payer: Self-pay | Admitting: Family Medicine

## 2015-12-30 ENCOUNTER — Encounter: Payer: Self-pay | Admitting: Family Medicine

## 2015-12-30 ENCOUNTER — Ambulatory Visit (INDEPENDENT_AMBULATORY_CARE_PROVIDER_SITE_OTHER): Payer: Medicare Other | Admitting: Family Medicine

## 2015-12-30 VITALS — BP 130/80 | HR 95

## 2015-12-30 DIAGNOSIS — M25552 Pain in left hip: Secondary | ICD-10-CM

## 2015-12-30 DIAGNOSIS — M461 Sacroiliitis, not elsewhere classified: Secondary | ICD-10-CM | POA: Diagnosis not present

## 2015-12-30 DIAGNOSIS — M1611 Unilateral primary osteoarthritis, right hip: Secondary | ICD-10-CM | POA: Diagnosis not present

## 2015-12-30 MED ORDER — TRAMADOL HCL 50 MG PO TABS: 50.0000 mg | ORAL_TABLET | Freq: Four times a day (QID) | ORAL | Status: DC | PRN

## 2015-12-30 NOTE — Telephone Encounter (Signed)
Take care of this 

## 2015-12-30 NOTE — Telephone Encounter (Signed)
Katherine Mccormick put order in system and called husband to let them know that they can go to get xray before appt today

## 2015-12-30 NOTE — Progress Notes (Signed)
   Subjective:    Patient ID: Katherine Mccormick, female    DOB: 07/19/1934, 82 y.o.   MRN: [id number]  HPI She complains of feeling a popping sensation in her right low back when she was getting out of chair and subsequent pain in that area approximately 1 week ago. No numbness, tingling or weakness.   Review of Systems     Objective:   Physical Exam Alert and complaining of pain. Tender to palpation over the right SI joint. Pearlean BrownieFaber test was positive. Negative straight leg raising. Normal hip motion. Katherine GrinderX-raysshow some degenerative changes.       Assessment & Plan:  Acute hip pain, left - Plan: traMADol (ULTRAM) 50 MG tablet, CANCELED: DG HIP UNILAT WITH PELVIS 2-3 VIEWS LEFT  Sacroiliitis (HCC) - Plan: traMADol (ULTRAM) 50 MG tablet recommend Aleve 2 pills twice per day and Ultram as needed, heat 20 minutes 3 times per day as well as stretching. Stretching exercises were demonstrated.

## 2015-12-30 NOTE — Telephone Encounter (Signed)
Pt's husband, Leonette MostCharles, is bringing pt in for an appt today at 3:15 but husband wants to know if they could go get an xray of her right hip before appt so that Dr Susann GivensLalonde will have the results sooner.  Hip pain started on Thursday and now pt can barely walk.

## 2015-12-30 NOTE — Telephone Encounter (Signed)
[facility].  I called [facility] and gave them rx.

## 2015-12-30 NOTE — Patient Instructions (Signed)
2 Aleve twice per day. Heat for 20 minutes 3 times per day and then do the stretching that I showed you. Use that tramadol as needed

## 2016-01-14 ENCOUNTER — Encounter: Payer: Self-pay | Admitting: Family Medicine

## 2016-01-14 ENCOUNTER — Ambulatory Visit (INDEPENDENT_AMBULATORY_CARE_PROVIDER_SITE_OTHER): Payer: Medicare Other | Admitting: Family Medicine

## 2016-01-14 VITALS — BP 136/88 | HR 76 | Resp 12 | Ht 65.0 in | Wt 184.0 lb

## 2016-01-14 DIAGNOSIS — M461 Sacroiliitis, not elsewhere classified: Secondary | ICD-10-CM | POA: Diagnosis not present

## 2016-01-14 NOTE — Progress Notes (Signed)
   Subjective:    Patient ID: Katherine Mccormick, female    DOB: 11/29/1933, 82 y.o.   MRN: [id number]  HPI She is here for recheck on her back pain. She has been stretching and doing heat and still having right-sided back pain. Apparently the hip is not giving her much trouble. She was given the name of a chiropractor to possibly help with this.   Review of Systems     Objective:   Physical Exam Alert and in no distress. Tender to palpation over the right SI joint. Splinting noted when she was getting out of the chair.       Assessment & Plan:  Sacroiliitis [facility](HCC) she will go to the chiropractor but if having difficulty, she is to call me and I will refer to physical therapy.

## 2016-09-02 DIAGNOSIS — H40052 Ocular hypertension, left eye: Secondary | ICD-10-CM | POA: Diagnosis not present

## 2016-09-02 DIAGNOSIS — Z961 Presence of intraocular lens: Secondary | ICD-10-CM | POA: Diagnosis not present

## 2016-09-02 DIAGNOSIS — H40051 Ocular hypertension, right eye: Secondary | ICD-10-CM | POA: Diagnosis not present

## 2016-09-02 DIAGNOSIS — Z0100 Encounter for examination of eyes and vision without abnormal findings: Secondary | ICD-10-CM | POA: Diagnosis not present

## 2016-11-25 DIAGNOSIS — H40051 Ocular hypertension, right eye: Secondary | ICD-10-CM | POA: Diagnosis not present

## 2017-05-24 DIAGNOSIS — H40051 Ocular hypertension, right eye: Secondary | ICD-10-CM | POA: Diagnosis not present

## 2017-06-01 ENCOUNTER — Ambulatory Visit: Payer: Medicare Other | Admitting: Family Medicine

## 2017-08-19 ENCOUNTER — Ambulatory Visit (INDEPENDENT_AMBULATORY_CARE_PROVIDER_SITE_OTHER): Payer: Medicare Other | Admitting: Family Medicine

## 2017-08-19 ENCOUNTER — Encounter: Payer: Self-pay | Admitting: Family Medicine

## 2017-08-19 VITALS — BP 140/90 | HR 88 | Ht 64.5 in | Wt 189.0 lb

## 2017-08-19 DIAGNOSIS — Z0000 Encounter for general adult medical examination without abnormal findings: Secondary | ICD-10-CM | POA: Diagnosis not present

## 2017-08-19 DIAGNOSIS — H259 Unspecified age-related cataract: Secondary | ICD-10-CM | POA: Diagnosis not present

## 2017-08-19 DIAGNOSIS — G5782 Other specified mononeuropathies of left lower limb: Secondary | ICD-10-CM

## 2017-08-19 DIAGNOSIS — H8110 Benign paroxysmal vertigo, unspecified ear: Secondary | ICD-10-CM

## 2017-08-19 DIAGNOSIS — M1990 Unspecified osteoarthritis, unspecified site: Secondary | ICD-10-CM | POA: Diagnosis not present

## 2017-08-19 DIAGNOSIS — Z1231 Encounter for screening mammogram for malignant neoplasm of breast: Secondary | ICD-10-CM

## 2017-08-19 DIAGNOSIS — Z1239 Encounter for other screening for malignant neoplasm of breast: Secondary | ICD-10-CM

## 2017-08-19 LAB — POCT URINALYSIS DIP (PROADVANTAGE DEVICE)
Bilirubin, UA: NEGATIVE
Blood, UA: NEGATIVE
Glucose, UA: NEGATIVE mg/dL
Ketones, POC UA: NEGATIVE mg/dL
Nitrite, UA: POSITIVE — AB
Protein Ur, POC: NEGATIVE mg/dL
Specific Gravity, Urine: 1.025
Urobilinogen, Ur: NEGATIVE
pH, UA: 6 (ref 5.0–8.0)

## 2017-08-19 LAB — CBC WITH DIFFERENTIAL/PLATELET
Basophils Absolute: 72 cells/uL (ref 0–200)
Basophils Relative: 1.1 %
Eosinophils Absolute: 403 cells/uL (ref 15–500)
Eosinophils Relative: 6.2 %
HCT: 41.4 % (ref 35.0–45.0)
Hemoglobin: 13.4 g/dL (ref 11.7–15.5)
Lymphs Abs: 1027 cells/uL (ref 850–3900)
MCH: 26.4 pg — ABNORMAL LOW (ref 27.0–33.0)
MCHC: 32.4 g/dL (ref 32.0–36.0)
MCV: 81.7 fL (ref 80.0–100.0)
MPV: 11 fL (ref 7.5–12.5)
Monocytes Relative: 11.3 %
Neutro Abs: 4264 cells/uL (ref 1500–7800)
Neutrophils Relative %: 65.6 %
Platelets: 258 10*3/uL (ref 140–400)
RBC: 5.07 10*6/uL (ref 3.80–5.10)
RDW: 13.9 % (ref 11.0–15.0)
Total Lymphocyte: 15.8 %
WBC mixed population: 735 cells/uL (ref 200–950)
WBC: 6.5 10*3/uL (ref 3.8–10.8)

## 2017-08-19 LAB — COMPREHENSIVE METABOLIC PANEL
AG Ratio: 1.3 (calc) (ref 1.0–2.5)
ALT: 20 U/L (ref 6–29)
AST: 22 U/L (ref 10–35)
Albumin: 4.1 g/dL (ref 3.6–5.1)
Alkaline phosphatase (APISO): 79 U/L (ref 33–130)
BUN/Creatinine Ratio: 14 (calc) (ref 6–22)
BUN: 14 mg/dL (ref 7–25)
CO2: 25 mmol/L (ref 20–32)
Calcium: 9.6 mg/dL (ref 8.6–10.4)
Chloride: 108 mmol/L (ref 98–110)
Creat: 0.98 mg/dL — ABNORMAL HIGH (ref 0.60–0.88)
Globulin: 3.2 g/dL (calc) (ref 1.9–3.7)
Glucose, Bld: 104 mg/dL — ABNORMAL HIGH (ref 65–99)
Potassium: 4.2 mmol/L (ref 3.5–5.3)
Sodium: 141 mmol/L (ref 135–146)
Total Bilirubin: 0.3 mg/dL (ref 0.2–1.2)
Total Protein: 7.3 g/dL (ref 6.1–8.1)

## 2017-08-19 NOTE — Progress Notes (Addendum)
Katherine Mccormick is a 83 y.o. female who presents for annual wellness visit and follow-up on chronic medical conditions.  She has the following concerns: She notes occasional bouts of dizziness if she gets up too quickly but then also mentions the fact that she is noted when she looks up she becomes dizzy but no blurred or double vision. She does have underlying arthritis and occasionally uses OTC pain meds for this. She continues have difficulty with her leg from the sural nerve injury. She was placed on gabapentin but stopped due to unacceptable side effects. Her immunizations were reviewed. She is not interested in any shots of any kind. She does check her blood pressure at home and it usually runs quite well of around 130/80. She does have a cataract.  Immunizations and Health Maintenance Immunization History  Administered Date(s) Administered  . Tdap 01/18/2012   Health Maintenance Due  Topic Date Due  . PNA vac Low Risk Adult (1 of 2 - PCV13) 04/17/2001    Last Pap smear: had hysterectomy Last mammogram: 2015 Last colonoscopy: not sure Last DEXA: 2013 Dentist: ? Ophtho: Dr. Ranae Pila Exercise: walking daily  Other doctors caring for patient include: Eye- Dr. Ranae Pila  Advanced directives: No. Information given.    Depression screen:  See questionnaire below.  Depression screen [place] 2/9 08/19/2017 07/04/2014 07/04/2014 01/18/2012  Decreased Interest 0 0 0 1  Down, Depressed, Hopeless 0 0 0 0  PHQ - 2 Score 0 0 0 1    Fall Risk Screen: see questionnaire below. Fall Risk  08/19/2017 07/04/2014  Falls in the past year? No No    ADL screen:  See questionnaire below Functional Status Survey: Is the patient deaf or have difficulty hearing?: No Does the patient have difficulty seeing, even when wearing glasses/contacts?: Yes (cataract on right eye) Does the patient have difficulty concentrating, remembering, or making decisions?: No Does the patient have difficulty walking or climbing stairs?:  Yes (sometimes gets off balance) Does the patient have difficulty dressing or bathing?: No Does the patient have difficulty doing errands alone such as visiting a doctor's office or shopping?: No   Review of Systems Negative except as above   PHYSICAL EXAM:  General Appearance: Alert, cooperative, no distress, appears stated age Head: Normocephalic, without obvious abnormality, atraumatic Eyes: PERRL, conjunctiva/corneas clear, EOM's intact, fundi benign Ears: Normal TM's and external ear canals Nose: Nares normal, mucosa normal, no drainage or sinus tenderness Throat: Lips, mucosa, and tongue normal; teeth and gums normal Neck: Supple, no lymphadenopathy;  thyroid:  no enlargement/tenderness/nodules; no carotid bruit or JVD Lungs: Clear to auscultation bilaterally without wheezes, rales or ronchi; respirations unlabored Heart: Regular rate and rhythm, S1 and S2 normal, no murmur, rubor gallop Abdomen: Soft, non-tender, nondistended, normoactive bowel sounds,  no masses, no hepatosplenomegaly Extremities: No clubbing, cyanosis or edema Pulses: 2+ and symmetric all extremities Skin:  Skin color, texture, turgor normal, no rashes or lesions Lymph nodes: Cervical, supraclavicular, and axillary nodes normal Neurologic:  CNII-XII intact, normal strength, sensation and gait; reflexes 2+ and symmetric throughout Psych: Normal mood, affect, hygiene and grooming.  ASSESSMENT/PLAN: Routine general medical examination at a health care facility - Plan: POCT Urinalysis DIP (Proadvantage Device), CBC with Differential/Platelet, Comprehensive metabolic panel  Screening for breast cancer - Plan: MM DIGITAL SCREENING BILATERAL  Arthritis - Plan: CBC with Differential/Platelet, Comprehensive metabolic panel  Benign paroxysmal positional vertigo, unspecified laterality - Plan: CBC with Differential/Platelet, Comprehensive metabolic panel  Neuritis of left sural nerve  Senile  cataract of left  eye, unspecified age-related cataract type She will be sent to physical therapy for Epley maneuvers. Discussed immunizations with her but she is totally against having any of these done. Did recommend she come back and a month and bring her blood pressure cuff with her so we can measure that against ours. Also discussed the nerve issue with her and at this point she is not interested in pursuing the Recommend supportive care for her arthritis symptoms. The urine dipstick was positive for nitrites however she is having no symptoms and will therefore not treat her.    Immunization recommendations discussed.  Colonoscopy recommendations reviewed   Medicare Attestation I have personally reviewed: The patient's medical and social history Their use of alcohol, tobacco or illicit drugs Their current medications and supplements The patient's functional ability including ADLs,fall risks, home safety risks, cognitive, and hearing and visual impairment Diet and physical activities Evidence for depression or mood disorders  The patient's weight, height, and BMI have been recorded in the chart.  I have made referrals, counseling, and provided education to the patient based on review of the above and I have provided the patient with a written personalized care plan for preventive services.     Katherine Mccormick,Katherine CHARLES, MD   08/19/2017

## 2017-08-27 ENCOUNTER — Encounter: Payer: Self-pay | Admitting: Internal Medicine

## 2017-11-08 DIAGNOSIS — H40051 Ocular hypertension, right eye: Secondary | ICD-10-CM | POA: Diagnosis not present

## 2018-05-09 DIAGNOSIS — H40051 Ocular hypertension, right eye: Secondary | ICD-10-CM | POA: Diagnosis not present

## 2018-11-18 DIAGNOSIS — H40051 Ocular hypertension, right eye: Secondary | ICD-10-CM | POA: Diagnosis not present

## 2019-01-31 ENCOUNTER — Telehealth: Payer: Self-pay

## 2019-01-31 NOTE — Telephone Encounter (Signed)
Pt pick up in voice mail. KH

## 2019-02-09 ENCOUNTER — Ambulatory Visit: Payer: Medicare Other | Admitting: Family Medicine

## 2019-02-09 ENCOUNTER — Other Ambulatory Visit: Payer: Self-pay

## 2019-02-09 NOTE — Progress Notes (Signed)
Katherine Mccormick is a 85 y.o. female who presents for annual wellness visit and follow-up on chronic medical conditions.  She has the following concerns:  Immunizations and Health Maintenance Immunization History  Administered Date(s) Administered  . Tdap 01/18/2012   Health Maintenance Due  Topic Date Due  . PNA vac Low Risk Adult (1 of 2 - PCV13) 04/17/2001    Last Pap smear: Aged out  Last mammogram: aged out  Last colonoscopy:[facility] preformed last  Last DEXA:01-21-12 Dentist: over one year Ophtho:10-2018 Exercise:walking  Other doctors caring for patient include: Katherine Mccormick [facility]  Advanced directives:    Depression screen:  See questionnaire below.  Depression screen Katherine Mccormick 2/9 08/19/2017 07/04/2014 07/04/2014 01/18/2012  Decreased Interest 0 0 0 1  Down, Depressed, Hopeless 0 0 0 0  PHQ - 2 Score 0 0 0 1    Fall Risk Screen: see questionnaire below. Fall Risk  08/19/2017 07/04/2014  Falls in the past year? No No    ADL screen:  See questionnaire below Functional Status Survey:     Review of Systems Constitutional: -, -unexpected weight change, -anorexia, -fatigue Allergy: -sneezing, -itching, -congestion Dermatology: denies changing moles, rash, lumps ENT: -runny nose, -ear pain, -sore throat,  Cardiology:  -chest pain, -palpitations, -orthopnea, Respiratory: -cough, -shortness of breath, -dyspnea on exertion, -wheezing,  Gastroenterology: -abdominal pain, -nausea, -vomiting, -diarrhea, -constipation, -dysphagia Hematology: -bleeding or bruising problems Musculoskeletal: -arthralgias, -myalgias, -joint swelling, -back pain, - Ophthalmology: -vision changes,  Urology: -dysuria, -difficulty urinating,  -urinary frequency, -urgency, incontinence Neurology: -, -numbness, , -memory loss, -falls, -dizziness    PHYSICAL EXAM:  There were no vitals taken for this visit.  General Appearance: Alert, cooperative, no distress, appears stated age Head: Normocephalic, without  obvious abnormality, atraumatic Eyes: PERRL, conjunctiva/corneas clear, EOM's intact, fundi benign Ears: Normal TM's and external ear canals Nose: Nares normal, mucosa normal, no drainage or sinus tenderness Throat: Lips, mucosa, and tongue normal; teeth and gums normal Neck: Supple, no lymphadenopathy;  thyroid:  no enlargement/tenderness/nodules; no carotid bruit or JVD Lungs: Clear to auscultation bilaterally without wheezes, rales or ronchi; respirations unlabored Heart: Regular rate and rhythm, S1 and S2 normal, no murmur, rubor gallop Abdomen: Soft, non-tender, nondistended, normoactive bowel sounds,  no masses, no hepatosplenomegaly Extremities: No clubbing, cyanosis or edema Pulses: 2+ and symmetric all extremities Skin:  Skin color, texture, turgor normal, no rashes or lesions Lymph nodes: Cervical, supraclavicular, and axillary nodes normal Neurologic:  CNII-XII intact, normal strength, sensation and gait; reflexes 2+ and symmetric throughout Psych: Normal mood, affect, hygiene and grooming.  ASSESSMENT/PLAN:    Discussed monthly self breast exams and yearly mammograms; at least 30 minutes of aerobic activity at least 5 days/week and weight-bearing exercise 2x/week; proper sunscreen use reviewed; healthy diet, including goals of calcium and vitamin D intake and alcohol recommendations (less than or equal to 1 drink/day) reviewed; regular seatbelt use; changing batteries in smoke detectors.  Immunization recommendations discussed.  Colonoscopy recommendations reviewed   Medicare Attestation I have personally reviewed: The patient's medical and social history Their use of alcohol, tobacco or illicit drugs Their current medications and supplements The patient's functional ability including ADLs,fall risks, home safety risks, cognitive, and hearing and visual impairment Diet and physical activities Evidence for depression or mood disorders  The patient's weight, height, and BMI  have been recorded in the chart.  I have made referrals, counseling, and provided education to the patient based on review of the above and I have provided the patient with a  written personalized care plan for preventive services.     Sharlot GowdaJohn Lalonde, MD   02/09/2019

## 2019-06-02 ENCOUNTER — Encounter: Payer: Self-pay | Admitting: Family Medicine

## 2019-06-02 ENCOUNTER — Telehealth: Payer: Self-pay | Admitting: Family Medicine

## 2019-06-02 ENCOUNTER — Ambulatory Visit (INDEPENDENT_AMBULATORY_CARE_PROVIDER_SITE_OTHER): Payer: Medicare Other | Admitting: Family Medicine

## 2019-06-02 ENCOUNTER — Other Ambulatory Visit: Payer: Self-pay

## 2019-06-02 VITALS — BP 138/88 | HR 72 | Temp 98.7°F | Ht 64.0 in | Wt 185.0 lb

## 2019-06-02 DIAGNOSIS — H259 Unspecified age-related cataract: Secondary | ICD-10-CM | POA: Diagnosis not present

## 2019-06-02 DIAGNOSIS — R7309 Other abnormal glucose: Secondary | ICD-10-CM | POA: Diagnosis not present

## 2019-06-02 DIAGNOSIS — L821 Other seborrheic keratosis: Secondary | ICD-10-CM | POA: Diagnosis not present

## 2019-06-02 DIAGNOSIS — M1990 Unspecified osteoarthritis, unspecified site: Secondary | ICD-10-CM

## 2019-06-02 NOTE — Telephone Encounter (Signed)
Pt called back and states that dermatologist name is Dr. Renda Rolls.

## 2019-06-02 NOTE — Progress Notes (Signed)
Katherine Mccormick is a 85 y.o. female who presents for annual wellness visit She has a lesion present on the right cheek that has been there for several years.  It is now starting to cause some slight itching.  She also has had some knee arthritis and has had an injection which did help.  She is not interested in knee replacement.  She does have a cataract and does get follow-up on that.  She has no other concerns or complaints.  Immunizations and Health Maintenance Immunization History  Administered Date(s) Administered  . Tdap 01/18/2012   Health Maintenance Due  Topic Date Due  . PNA vac Low Risk Adult (1 of 2 - PCV13) 04/17/2001  . INFLUENZA VACCINE  05/27/2019    Last Pap smear: Aged out  Last mammogram: aged out  Last colonoscopy:aged out  Last DEXA: 01/21/12 Dentist: over two years Ophtho: 06/23/19 Exercise: walking and yard work   Other doctors caring for patient include: Yats  Advanced directives:no Does Patient Have a Medical Advance Directive?: No Would patient like information on creating a medical advance directive?: No - Patient declined  Depression screen:  See questionnaire below.  [facility] 06/02/2019 02/09/2019 08/19/2017 07/04/2014 07/04/2014  Decreased Interest 0 0 0 0 0  Down, Depressed, Hopeless 0 0 0 0 0  PHQ - 2 Score 0 0 0 0 0    Fall Risk Screen: see questionnaire below. Fall Risk  06/02/2019 02/09/2019 08/19/2017 07/04/2014  Falls in the past year? 0 0 No No    ADL screen:  See questionnaire below Functional Status Survey: Is the patient deaf or have difficulty hearing?: No Does the patient have difficulty seeing, even when wearing glasses/contacts?: No Does the patient have difficulty concentrating, remembering, or making decisions?: No Does the patient have difficulty walking or climbing stairs?: Yes(knee problem) Does the patient have difficulty dressing or bathing?: No Does the patient have difficulty doing errands alone such as visiting a  doctor's office or shopping?: No   Review of Systems Constitutional: -, -unexpected weight change, -anorexia, -fatigue Allergy: -sneezing, -itching, -congestion  ENT: -runny nose, -ear pain, -sore throat,  Cardiology:  -chest pain, -palpitations, -orthopnea, Respiratory: -cough, -shortness of breath, -dyspnea on exertion, -wheezing,  Gastroenterology: -abdominal pain, -nausea, -vomiting, -diarrhea, -constipation, -dysphagia Hematology: -bleeding or bruising problems Musculoskeletal: -arthralgias, -myalgias, -joint swelling, -back pain, - Ophthalmology: -vision changes,  Urology: -dysuria, -difficulty urinating,  -urinary frequency, -urgency, incontinence Neurology: -, -numbness, , -memory loss, -falls, -dizziness    PHYSICAL EXAM:  General Appearance: Alert, cooperative, no distress, appears stated age Head: Normocephalic, without obvious abnormality, atraumatic Eyes: PERRL, conjunctiva/corneas clear, EOM's intact, fundi benign Ears: Normal TM's and external ear canals Nose: Nares normal, mucosa normal, no drainage or sinus tenderness Throat: Lips, mucosa, and tongue normal; teeth and gums normal Neck: Supple, no lymphadenopathy;  thyroid:  no enlargement/tenderness/nodules; no carotid bruit or JVD Lungs: Clear to auscultation bilaterally without wheezes, rales or ronchi; respirations unlabored Heart: Regular rate and rhythm, S1 and S2 normal, no murmur, rubor gallop Abdomen: Soft, non-tender, nondistended, normoactive bowel sounds,  no masses, no hepatosplenomegaly Extremities: No clubbing, cyanosis or edema Pulses: 2+ and symmetric all extremities Skin:  Skin color, texture, turgor normal, pigmented raised 1/2 cm lesion is noted on the right cheek.   Lymph nodes: Cervical, supraclavicular, and axillary nodes normal Neurologic:  CNII-XII intact, normal strength, sensation and gait; reflexes 2+ and symmetric throughout Psych: Normal mood, affect, hygiene and  grooming.  ASSESSMENT/PLAN: Arthritis -  Plan: CBC with Differential/Platelet, Comprehensive metabolic panel, Senile cataract of left eye, unspecified age-related cataract type - Plan: She plans to follow-up with ophthalmology  Elevated glucose - Plan: Comprehensive metabolic panel,   Seborrheic keratosis - Plan: She will call with the name of the dermatologist that she would like to evaluate t I discussed health maintenance issues with her in regard to immunizations.  I explained in detail the benefits that they provide however she is not interested in any injection. The patient's weight, height, and BMI have been recorded in the chart.  I have made referrals, counseling, and provided education to the patient based on review of the above and I have provided the patient with a written personalized care plan for preventive services.     Jill Alexanders, MD   06/02/2019

## 2019-06-02 NOTE — Telephone Encounter (Signed)
Refer her to this doctor for evaluation of the facial lesion

## 2019-06-02 NOTE — Telephone Encounter (Signed)
Pt was called to provide her with Dermatology phone number . The office says she will not need a referral and their number is [phone]. LVM for pt to call back for the info. [place]

## 2019-06-03 LAB — COMPREHENSIVE METABOLIC PANEL
ALT: 20 IU/L (ref 0–32)
AST: 22 IU/L (ref 0–40)
Albumin/Globulin Ratio: 1.5 (ref 1.2–2.2)
Albumin: 4.4 g/dL (ref 3.6–4.6)
Alkaline Phosphatase: 83 IU/L (ref 39–117)
BUN/Creatinine Ratio: 10 — ABNORMAL LOW (ref 12–28)
BUN: 9 mg/dL (ref 8–27)
Bilirubin Total: 0.4 mg/dL (ref 0.0–1.2)
CO2: 24 mmol/L (ref 20–29)
Calcium: 9.6 mg/dL (ref 8.7–10.3)
Chloride: 107 mmol/L — ABNORMAL HIGH (ref 96–106)
Creatinine, Ser: 0.92 mg/dL (ref 0.57–1.00)
GFR calc Af Amer: 67 mL/min/{1.73_m2} (ref >59)
GFR calc non Af Amer: 58 mL/min/{1.73_m2} — ABNORMAL LOW (ref >59)
Globulin, Total: 2.9 g/dL (ref 1.5–4.5)
Glucose: 102 mg/dL — ABNORMAL HIGH (ref 65–99)
Potassium: 4.7 mmol/L (ref 3.5–5.2)
Sodium: 144 mmol/L (ref 134–144)
Total Protein: 7.3 g/dL (ref 6.0–8.5)

## 2019-06-03 LAB — CBC WITH DIFFERENTIAL/PLATELET
Basophils Absolute: 0.1 10*3/uL (ref 0.0–0.2)
Basos: 1 %
EOS (ABSOLUTE): 0.5 10*3/uL — ABNORMAL HIGH (ref 0.0–0.4)
Eos: 7 %
Hematocrit: 42.9 % (ref 34.0–46.6)
Hemoglobin: 13.2 g/dL (ref 11.1–15.9)
Immature Grans (Abs): 0 10*3/uL (ref 0.0–0.1)
Immature Granulocytes: 0 %
Lymphocytes Absolute: 1 10*3/uL (ref 0.7–3.1)
Lymphs: 14 %
MCH: 26.6 pg (ref 26.6–33.0)
MCHC: 30.8 g/dL — ABNORMAL LOW (ref 31.5–35.7)
MCV: 87 fL (ref 79–97)
Monocytes Absolute: 0.7 10*3/uL (ref 0.1–0.9)
Monocytes: 10 %
Neutrophils Absolute: 5 10*3/uL (ref 1.4–7.0)
Neutrophils: 68 %
Platelets: 252 10*3/uL (ref 150–450)
RBC: 4.96 x10E6/uL (ref 3.77–5.28)
RDW: 14.2 % (ref 11.7–15.4)
WBC: 7.4 10*3/uL (ref 3.4–10.8)

## 2019-06-22 DIAGNOSIS — L821 Other seborrheic keratosis: Secondary | ICD-10-CM | POA: Diagnosis not present

## 2019-06-23 DIAGNOSIS — H40051 Ocular hypertension, right eye: Secondary | ICD-10-CM | POA: Diagnosis not present

## 2019-06-23 DIAGNOSIS — H2512 Age-related nuclear cataract, left eye: Secondary | ICD-10-CM | POA: Diagnosis not present

## 2019-12-22 ENCOUNTER — Ambulatory Visit: Payer: Medicare Other | Attending: Internal Medicine

## 2019-12-22 DIAGNOSIS — Z23 Encounter for immunization: Secondary | ICD-10-CM | POA: Insufficient documentation

## 2019-12-22 NOTE — Progress Notes (Signed)
   Covid-19 Vaccination Clinic  Name:  Katherine Mccormick    MRN: [id number] DOB: 08-18-1934  12/22/2019  Katherine Mccormick was observed post Covid-19 immunization for 15 minutes without incidence. She was provided with Vaccine Information Sheet and instruction to access the V-Safe system.   Katherine Mccormick was instructed to call 911 with any severe reactions post vaccine: Marland Kitchen Difficulty breathing  . Swelling of your face and throat  . A fast heartbeat  . A bad rash all over your body  . Dizziness and weakness    Immunizations Administered    Name Date Dose VIS Date Route   Pfizer COVID-19 Vaccine 12/22/2019 12:54 PM 0.3 mL 10/06/2019 Intramuscular   Manufacturer: [employer], [employer]   Lot: [id number]   NDC: [address]-5

## 2019-12-25 DIAGNOSIS — H40053 Ocular hypertension, bilateral: Secondary | ICD-10-CM | POA: Diagnosis not present

## 2019-12-25 DIAGNOSIS — Z961 Presence of intraocular lens: Secondary | ICD-10-CM | POA: Diagnosis not present

## 2020-01-16 ENCOUNTER — Ambulatory Visit: Payer: Medicare Other | Attending: Internal Medicine

## 2020-01-16 DIAGNOSIS — Z23 Encounter for immunization: Secondary | ICD-10-CM

## 2020-01-16 NOTE — Progress Notes (Signed)
   Covid-19 Vaccination Clinic  Name:  Katherine Mccormick    MRN: [id number] DOB: 03/07/1934  01/16/2020  [name] was observed post Covid-19 immunization for 15 minutes without incident. She was provided with Vaccine Information Sheet and instruction to access the V-Safe system.   [name] was instructed to call 911 with any severe reactions post vaccine: Marland Kitchen Difficulty breathing  . Swelling of face and throat  . A fast heartbeat  . A bad rash all over body  . Dizziness and weakness   Immunizations Administered    Name Date Dose VIS Date Route   Pfizer COVID-19 Vaccine 01/16/2020  3:49 PM 0.3 mL 10/06/2019 Intramuscular   Manufacturer: [employer], [employer]   Lot: [id number]   NDC: [address]-5

## 2020-03-12 ENCOUNTER — Ambulatory Visit (INDEPENDENT_AMBULATORY_CARE_PROVIDER_SITE_OTHER): Payer: Medicare Other | Admitting: Family Medicine

## 2020-03-12 ENCOUNTER — Ambulatory Visit
Admission: RE | Admit: 2020-03-12 | Discharge: 2020-03-12 | Disposition: A | Discharge status: Home / Self Care | Payer: Medicare Other | Source: Ambulatory Visit | Attending: Family Medicine | Admitting: Family Medicine

## 2020-03-12 ENCOUNTER — Other Ambulatory Visit: Payer: Self-pay

## 2020-03-12 ENCOUNTER — Encounter: Payer: Self-pay | Admitting: Family Medicine

## 2020-03-12 VITALS — BP 152/90 | HR 76 | Temp 98.0°F | Wt 183.6 lb

## 2020-03-12 DIAGNOSIS — M545 Low back pain, unspecified: Secondary | ICD-10-CM

## 2020-03-12 NOTE — Patient Instructions (Signed)
Go ahead and take 2 Aleve twice per day

## 2020-03-12 NOTE — Progress Notes (Signed)
   Subjective:    Patient ID: Katherine Mccormick, female    DOB: 08-27-1934, 86 y.o.   MRN: [id number]  HPI She states that she woke up Sunday morning complaining of left hip pain.  No history of injury or previous difficulty with that.  She states that any motion makes this worse.  She also notes that when she urinates, the pain does diminish slightly.  She is not having any urgency or frequency.   Review of Systems     Objective:   Physical Exam Alert and splinting with motion.  Tender to palpation over the left SI joint.  Pearlean Brownie testing was also positive.  Normal hip motion.  No tenderness over lumbar spine.  Negative straight leg raising.       Assessment & Plan:  Acute left-sided low back pain without sciatica - Plan: DG Lumbar Spine Complete, Urinalysis Dipstick She was unable to produce a specimen and plans to bring it in tomorrow.

## 2020-03-13 ENCOUNTER — Telehealth: Payer: Self-pay

## 2020-03-13 ENCOUNTER — Other Ambulatory Visit (INDEPENDENT_AMBULATORY_CARE_PROVIDER_SITE_OTHER): Payer: Medicare Other

## 2020-03-13 DIAGNOSIS — N3000 Acute cystitis without hematuria: Secondary | ICD-10-CM

## 2020-03-13 DIAGNOSIS — M545 Low back pain, unspecified: Secondary | ICD-10-CM

## 2020-03-13 LAB — POCT URINALYSIS DIP (PROADVANTAGE DEVICE)
Bilirubin, UA: NEGATIVE
Blood, UA: NEGATIVE
Glucose, UA: NEGATIVE mg/dL
Ketones, POC UA: NEGATIVE mg/dL
Nitrite, UA: POSITIVE — AB
Specific Gravity, Urine: 1.02
Urobilinogen, Ur: 0.2
pH, UA: 6 (ref 5.0–8.0)

## 2020-03-13 MED ORDER — SULFAMETHOXAZOLE-TRIMETHOPRIM 800-160 MG PO TABS: 1.0000 | ORAL_TABLET | Freq: Two times a day (BID) | ORAL | 0 refills | Status: DC

## 2020-03-13 NOTE — Progress Notes (Signed)
The urinalysis is nitrite positive.  I will treat her with Septra.

## 2020-03-13 NOTE — Telephone Encounter (Signed)
Please see pt U/A results and advised. KH

## 2020-03-13 NOTE — Addendum Note (Signed)
Addended by: [name] on: 03/13/2020 02:56 PM   Modules accepted: Orders

## 2020-09-07 ENCOUNTER — Ambulatory Visit: Payer: Medicare Other

## 2020-10-04 DIAGNOSIS — H43811 Vitreous degeneration, right eye: Secondary | ICD-10-CM | POA: Diagnosis not present

## 2020-10-04 DIAGNOSIS — H40053 Ocular hypertension, bilateral: Secondary | ICD-10-CM | POA: Diagnosis not present

## 2020-10-28 ENCOUNTER — Other Ambulatory Visit: Payer: Medicare Other

## 2020-11-01 ENCOUNTER — Other Ambulatory Visit: Payer: Medicare Other

## 2020-11-01 DIAGNOSIS — Z20822 Contact with and (suspected) exposure to covid-19: Secondary | ICD-10-CM

## 2020-11-04 LAB — NOVEL CORONAVIRUS, NAA: SARS-CoV-2, NAA: NOT DETECTED

## 2021-02-13 ENCOUNTER — Telehealth: Payer: Self-pay

## 2021-02-13 NOTE — Telephone Encounter (Signed)
Metric close. KH

## 2021-02-14 ENCOUNTER — Encounter: Payer: Self-pay | Admitting: Family Medicine

## 2021-02-14 ENCOUNTER — Ambulatory Visit (INDEPENDENT_AMBULATORY_CARE_PROVIDER_SITE_OTHER): Payer: Medicare Other | Admitting: Family Medicine

## 2021-02-14 ENCOUNTER — Other Ambulatory Visit: Payer: Self-pay

## 2021-02-14 VITALS — BP 158/90 | HR 76 | Temp 97.7°F | Ht 63.5 in | Wt 187.2 lb

## 2021-02-14 DIAGNOSIS — Z9841 Cataract extraction status, right eye: Secondary | ICD-10-CM | POA: Diagnosis not present

## 2021-02-14 DIAGNOSIS — L821 Other seborrheic keratosis: Secondary | ICD-10-CM | POA: Diagnosis not present

## 2021-02-14 DIAGNOSIS — H8110 Benign paroxysmal vertigo, unspecified ear: Secondary | ICD-10-CM | POA: Diagnosis not present

## 2021-02-14 DIAGNOSIS — R030 Elevated blood-pressure reading, without diagnosis of hypertension: Secondary | ICD-10-CM | POA: Diagnosis not present

## 2021-02-14 DIAGNOSIS — Z0000 Encounter for general adult medical examination without abnormal findings: Secondary | ICD-10-CM | POA: Diagnosis not present

## 2021-02-14 DIAGNOSIS — M47819 Spondylosis without myelopathy or radiculopathy, site unspecified: Secondary | ICD-10-CM

## 2021-02-14 DIAGNOSIS — Z2821 Immunization not carried out because of patient refusal: Secondary | ICD-10-CM | POA: Diagnosis not present

## 2021-02-14 DIAGNOSIS — J309 Allergic rhinitis, unspecified: Secondary | ICD-10-CM

## 2021-02-14 NOTE — Progress Notes (Signed)
Katherine Mccormick is a 87 y.o. female who presents for annual wellness visit,CPE and follow-up on chronic medical conditions.  She has no particular concerns or complaints.  Immunizations were reviewed and she is not interested in having shingles or pneumonia shot.  She does have underlying allergies but they are not bad enough that she wants them treated.  She does intermittently have left-sided back and leg discomfort which interferes with her walking.  She has a previous history of surgery as well as epidural injections.  She is not interested in pursuing this.  She does state that she has a several year history of when she looks upward she will get dizzy but no blurred vision, double vision, weakness, numbness or tingling.  No dizziness in any other position.  She does state that she has difficulty walking due to the tingling sensation down her left leg.  She has been married for over 60 years.  Does not smoke or drink.  Immunizations and Health Maintenance Immunization History  Administered Date(s) Administered  . PFIZER(Purple Top)SARS-COV-2 Vaccination 12/22/2019, 01/16/2020, 09/05/2020  . Tdap 01/18/2012   There are no preventive care reminders to display for this patient.  Last Pap smear: aged out  Last mammogram: 04/03/14 Last colonoscopy: [facility]  Last DEXA: 01/21/12 Dentist: Upcoming appointment Q Three months Ophtho: Q six months Exercise:N/A  Other doctors caring for patient include: Dr. Randon Goldsmith ophthalmology  Advanced directives: Does Patient Have a Medical Advance Directive?: No Would patient like information on creating a medical advance directive?: Yes (ED - Information included in AVS)  Depression screen:  See questionnaire below.  Depression screen [facility] 2/9 02/14/2021 02/13/2021 06/02/2019 02/09/2019 08/19/2017  Decreased Interest 0 0 0 0 0  Down, Depressed, Hopeless 0 0 0 0 0  PHQ - 2 Score 0 0 0 0 0    Fall Risk Screen: see questionnaire below. Fall Risk  02/14/2021 02/13/2021  06/02/2019 02/09/2019 08/19/2017  Falls in the past year? 0 0 0 0 No  Number falls in past yr: 0 0 - - -  Injury with Fall? 0 0 - - -  Risk for fall due to : No Fall Risks No Fall Risks - - -  Follow up Falls evaluation completed Falls evaluation completed - - -    ADL screen:  See questionnaire below Functional Status Survey: Is the patient deaf or have difficulty hearing?: No Does the patient have difficulty seeing, even when wearing glasses/contacts?: No Does the patient have difficulty concentrating, remembering, or making decisions?: No Does the patient have difficulty walking or climbing stairs?: No Does the patient have difficulty dressing or bathing?: No Does the patient have difficulty doing errands alone such as visiting a doctor's office or shopping?: No   Review of Systems Constitutional: -, -unexpected weight change, -anorexia, -fatigue Allergy: -sneezing, -itching, -congestion Dermatology: denies changing moles, rash, lumps ENT: -runny nose, -ear pain, -sore throat,  Cardiology:  -chest pain, -palpitations, -orthopnea, Respiratory: -cough, -shortness of breath, -dyspnea on exertion, -wheezing,  Gastroenterology: -abdominal pain, -nausea, -vomiting, -diarrhea, -constipation, -dysphagia Hematology: -bleeding or bruising problems Musculoskeletal: -arthralgias, -myalgias, -joint swelling, -back pain, - Ophthalmology: -vision changes,  Urology: -dysuria, -difficulty urinating,  -urinary frequency, -urgency, incontinence Neurology: -, -numbness, , -memory loss, -falls, -dizziness    PHYSICAL EXAM: General Appearance: Alert, cooperative, no distress, appears stated age Head: Normocephalic, without obvious abnormality, atraumatic Eyes: PERRL, conjunctiva/corneas clear, EOM's intact. Ears: Normal TM's and external ear canals Nose: Nares normal, mucosa normal, no drainage or sinus tenderness  Throat: Lips, mucosa, and tongue normal; teeth and gums normal Neck: Supple, no  lymphadenopathy;  thyroid:  no enlargement/tenderness/nodules; no carotid bruit or JVD Lungs: Clear to auscultation bilaterally without wheezes, rales or ronchi; respirations unlabored Heart: Regular rate and rhythm, S1 and S2 normal, no murmur, rubor gallop Abdomen: Soft, non-tender, nondistended, normoactive bowel sounds,  no masses, no hepatosplenomegaly Extremities: No clubbing, cyanosis or edema Pulses: 2+ and symmetric all extremities Skin:  Skin color, texture, turgor normal, multiple pigmented slightly raised lesions are noted on her shoulders and torso. Lymph nodes: Cervical, supraclavicular, and axillary nodes normal Neurologic:  CNII-XII intact, normal strength, sensation and gait; reflexes 2+ and symmetric throughout Psych: Normal mood, affect, hygiene and grooming.  ASSESSMENT/PLAN: Routine general medical examination at a health care facility - Plan: CBC with Differential/Platelet, Comprehensive metabolic panel  Elevated blood pressure reading - Plan: CBC with Differential/Platelet, Comprehensive metabolic panel  Arthritis of low back  S/P cataract surgery, right  Seborrheic keratosis  Immunization refused  Benign paroxysmal positional vertigo, unspecified laterality - Plan: Ambulatory referral to Physical Therapy  Allergic rhinitis, unspecified seasonality, unspecified trigger She is not interested in treating her allergies. I discussed the elevated blood pressure and again she does not want to be placed on any medication. Also discussed possibly having her seen by orthopedics again for her leg discomfort for possible epidural.  She is reluctant to pursue this.    Immunization recommendations discussed.  At this point she is not at all interested in pursuing any further immunizations. Medicare Attestation I have personally reviewed: The patient's medical and social history Their use of alcohol, tobacco or illicit drugs Their current medications and  supplements The patient's functional ability including ADLs,fall risks, home safety risks, cognitive, and hearing and visual impairment Diet and physical activities Evidence for depression or mood disorders  The patient's weight, height, and BMI have been recorded in the chart.  I have made referrals, counseling, and provided education to the patient based on review of the above and I have provided the patient with a written personalized care plan for preventive services.     Sharlot Gowda, MD   02/14/2021

## 2021-02-15 LAB — COMPREHENSIVE METABOLIC PANEL
ALT: 23 IU/L (ref 0–32)
AST: 25 IU/L (ref 0–40)
Albumin/Globulin Ratio: 1.4 (ref 1.2–2.2)
Albumin: 4.3 g/dL (ref 3.6–4.6)
Alkaline Phosphatase: 93 IU/L (ref 44–121)
BUN/Creatinine Ratio: 12 (ref 12–28)
BUN: 10 mg/dL (ref 8–27)
Bilirubin Total: 0.4 mg/dL (ref 0.0–1.2)
CO2: 22 mmol/L (ref 20–29)
Calcium: 9.6 mg/dL (ref 8.7–10.3)
Chloride: 107 mmol/L — ABNORMAL HIGH (ref 96–106)
Creatinine, Ser: 0.86 mg/dL (ref 0.57–1.00)
Globulin, Total: 3.1 g/dL (ref 1.5–4.5)
Glucose: 101 mg/dL — ABNORMAL HIGH (ref 65–99)
Potassium: 4.3 mmol/L (ref 3.5–5.2)
Sodium: 148 mmol/L — ABNORMAL HIGH (ref 134–144)
Total Protein: 7.4 g/dL (ref 6.0–8.5)
eGFR: 67 mL/min/{1.73_m2} (ref >59)

## 2021-02-15 LAB — CBC WITH DIFFERENTIAL/PLATELET
Basophils Absolute: 0.1 10*3/uL (ref 0.0–0.2)
Basos: 1 %
EOS (ABSOLUTE): 0.2 10*3/uL (ref 0.0–0.4)
Eos: 3 %
Hematocrit: 40.8 % (ref 34.0–46.6)
Hemoglobin: 13.1 g/dL (ref 11.1–15.9)
Immature Grans (Abs): 0 10*3/uL (ref 0.0–0.1)
Immature Granulocytes: 0 %
Lymphocytes Absolute: 1.1 10*3/uL (ref 0.7–3.1)
Lymphs: 18 %
MCH: 27 pg (ref 26.6–33.0)
MCHC: 32.1 g/dL (ref 31.5–35.7)
MCV: 84 fL (ref 79–97)
Monocytes Absolute: 0.7 10*3/uL (ref 0.1–0.9)
Monocytes: 12 %
Neutrophils Absolute: 4 10*3/uL (ref 1.4–7.0)
Neutrophils: 66 %
Platelets: 238 10*3/uL (ref 150–450)
RBC: 4.86 x10E6/uL (ref 3.77–5.28)
RDW: 14.2 % (ref 11.7–15.4)
WBC: 6.2 10*3/uL (ref 3.4–10.8)

## 2021-04-10 DIAGNOSIS — H401131 Primary open-angle glaucoma, bilateral, mild stage: Secondary | ICD-10-CM | POA: Diagnosis not present

## 2021-05-29 DIAGNOSIS — H401131 Primary open-angle glaucoma, bilateral, mild stage: Secondary | ICD-10-CM | POA: Diagnosis not present

## 2021-12-04 DIAGNOSIS — H5203 Hypermetropia, bilateral: Secondary | ICD-10-CM | POA: Diagnosis not present

## 2021-12-04 DIAGNOSIS — H401131 Primary open-angle glaucoma, bilateral, mild stage: Secondary | ICD-10-CM | POA: Diagnosis not present

## 2022-02-20 ENCOUNTER — Encounter: Payer: Self-pay | Admitting: Family Medicine

## 2022-02-20 ENCOUNTER — Ambulatory Visit (INDEPENDENT_AMBULATORY_CARE_PROVIDER_SITE_OTHER): Payer: Medicare Other | Admitting: Family Medicine

## 2022-02-20 VITALS — BP 138/86 | HR 71 | Temp 97.7°F | Ht 63.0 in | Wt 180.6 lb

## 2022-02-20 DIAGNOSIS — Z9841 Cataract extraction status, right eye: Secondary | ICD-10-CM | POA: Diagnosis not present

## 2022-02-20 DIAGNOSIS — Z2821 Immunization not carried out because of patient refusal: Secondary | ICD-10-CM | POA: Diagnosis not present

## 2022-02-20 DIAGNOSIS — M47819 Spondylosis without myelopathy or radiculopathy, site unspecified: Secondary | ICD-10-CM

## 2022-02-20 DIAGNOSIS — R7301 Impaired fasting glucose: Secondary | ICD-10-CM | POA: Diagnosis not present

## 2022-02-20 DIAGNOSIS — R030 Elevated blood-pressure reading, without diagnosis of hypertension: Secondary | ICD-10-CM

## 2022-02-20 DIAGNOSIS — Z136 Encounter for screening for cardiovascular disorders: Secondary | ICD-10-CM | POA: Diagnosis not present

## 2022-02-20 DIAGNOSIS — Z0000 Encounter for general adult medical examination without abnormal findings: Secondary | ICD-10-CM | POA: Diagnosis not present

## 2022-02-20 DIAGNOSIS — J309 Allergic rhinitis, unspecified: Secondary | ICD-10-CM | POA: Diagnosis not present

## 2022-02-20 DIAGNOSIS — Z13220 Encounter for screening for lipoid disorders: Secondary | ICD-10-CM

## 2022-02-20 DIAGNOSIS — M8580 Other specified disorders of bone density and structure, unspecified site: Secondary | ICD-10-CM | POA: Diagnosis not present

## 2022-02-20 NOTE — Progress Notes (Signed)
Katherine Mccormick is a 88 y.o. female who presents for annual wellness visit and follow-up on chronic medical conditions.  She has no particular concerns or complaints.  Review of the record indicates that she has many immunizations that she needs renewed but at this point she is not interested in having any of them done.  She has had cataract surgery and is waiting to have the next one done.  She does have some minor difficulty with arthritis but does not complain of anything in particular.  Also her blood pressures have all been slightly elevated.  She does take OTC medications otherwise she is not taking anything in particular.  She has been married for roughly 60 years.  She does have issues with her husband sitting around a lot and not getting out and being more active. ? ?Immunizations and Health Maintenance ?Immunization History  ?Administered Date(s) Administered  ? PFIZER(Purple Top)SARS-COV-2 Vaccination 12/22/2019, 01/16/2020, 09/05/2020  ? Tdap 01/18/2012  ? ?There are no preventive care reminders to display for this patient. ? ? ?Last Pap smear:aged out  ?Last mammogram: aged out 04/03/2014 ?Last colonoscopy: aged out  [facility]  ?Last DEXA: 01/21/12  ?Dentist: Q year ?Ophtho: Q six months  ?Exercise: walking QD ?Other doctors caring for patient include: Dr. Randon Goldsmith eye  ? ?Advanced directives: ?Does Patient Have a Medical Advance Directive?: Yes ?Type of Advance Directive: Living will ?Does patient want to make changes to medical advance directive?: No - Patient declined ? ?Depression screen:  See questionnaire below.  ? ?  02/20/2022  ?  9:56 AM 02/14/2021  ?  9:31 AM 02/13/2021  ? 12:52 PM 06/02/2019  ?  8:48 AM 02/09/2019  ?  9:10 AM  ?Depression screen PHQ 2/9  ?Decreased Interest 0 0 0 0 0  ?Down, Depressed, Hopeless 0 0 0 0 0  ?PHQ - 2 Score 0 0 0 0 0  ? ? ?Fall Risk Screen: see questionnaire below. ? ?  02/20/2022  ?  9:55 AM 02/14/2021  ?  9:31 AM 02/13/2021  ? 12:52 PM 06/02/2019  ?  8:49 AM 02/09/2019  ?  9:09 AM   ?Fall Risk   ?Falls in the past year? 0 0 0 0 0  ?Number falls in past yr: 0 0 0    ?Injury with Fall? 0 0 0    ?Risk for fall due to : No Fall Risks No Fall Risks No Fall Risks    ?Follow up Falls evaluation completed Falls evaluation completed Falls evaluation completed    ? ? ?ADL screen:  See questionnaire below ?Functional Status Survey: ?Does the patient have difficulty seeing, even when wearing glasses/contacts?: No ?Does the patient have difficulty concentrating, remembering, or making decisions?: No ?Does the patient have difficulty walking or climbing stairs?: No ?Does the patient have difficulty dressing or bathing?: No ?Does the patient have difficulty doing errands alone such as visiting a doctor's office or shopping?: No ? ? ?Review of Systems ?Constitutional: -, -unexpected weight change, -anorexia, -fatigue ?Allergy: -sneezing, -itching, -congestion ?Dermatology: denies changing moles, rash, lumps ?ENT: -runny nose, -ear pain, -sore throat,  ?Cardiology:  -chest pain, -palpitations, -orthopnea, ?Respiratory: -cough, -shortness of breath, -dyspnea on exertion, -wheezing,  ?Gastroenterology: -abdominal pain, -nausea, -vomiting, -diarrhea, -constipation, -dysphagia ?Hematology: -bleeding or bruising problems ?Musculoskeletal: -arthralgias, -myalgias, -joint swelling, -back pain, - ?Ophthalmology: -vision changes,  ?Urology: -dysuria, -difficulty urinating,  -urinary frequency, -urgency, incontinence ?Neurology: -, -numbness, , -memory loss, -falls, -dizziness ? ? ? ?PHYSICAL EXAM: ?General Appearance:  Alert, cooperative, no distress, appears stated age ?Head: Normocephalic, without obvious abnormality, atraumatic ?Eyes: PERRL, conjunctiva/corneas clear, EOM's intact,  ?Ears: Normal TM's and external ear canals ?Nose: Nares normal, mucosa normal, no drainage or sinus tenderness ?Throat: Lips, mucosa, and tongue normal; teeth and gums normal ?Neck: Supple, no lymphadenopathy;  thyroid:  no  enlargement/tenderness/nodules; no carotid bruit or JVD ?Lungs: Clear to auscultation bilaterally without wheezes, rales or ronchi; respirations unlabored ?Heart: Regular rate and rhythm, S1 and S2 normal, no murmur, rubor gallop ?Abdomen: Soft, non-tender, nondistended, normoactive bowel sounds,  ?no masses, no hepatosplenomegaly ?Extremities: No clubbing, cyanosis or edema ?Pulses: 2+ and symmetric all extremities ?Skin:  Skin color, texture, turgor normal, no rashes or lesions ?Lymph nodes: Cervical, supraclavicular, and axillary nodes normal ?Neurologic:  CNII-XII intact, normal strength, sensation and gait; reflexes 2+ and symmetric throughout ?Psych: Normal mood, affect, hygiene and grooming. ? ?ASSESSMENT/PLAN: ?Allergic rhinitis, unspecified seasonality, unspecified trigger ? ?Arthritis of low back ? ?Elevated blood pressure reading - Plan: CBC with Differential/Platelet, Comprehensive metabolic panel ? ?S/P cataract surgery, right ? ?Immunization refused ? ?Osteopenia, unspecified location - Plan: DG Bone Density ? ?Screening for lipid disorders - Plan: Lipid panel ?We will continue to monitor her blood pressure.  Do not feel the need to treat it at the present time. ?Immunization recommendations discussed.  Explained that she can get some of her medications here and others need to be done at the drugstore. ? ?Medicare Attestation ?I have personally reviewed: ?The patient's medical and social history ?Their use of alcohol, tobacco or illicit drugs ?Their current medications and supplements ?The patient's functional ability including ADLs,fall risks, home safety risks, cognitive, and hearing and visual impairment ?Diet and physical activities ?Evidence for depression or mood disorders ? ?The patient's weight, height, and BMI have been recorded in the chart.  I have made referrals, counseling, and provided education to the patient based on review of the above and I have provided the patient with a written  personalized care plan for preventive services.   ? ? ?Sharlot Gowda, MD   02/20/2022  ?

## 2022-02-21 LAB — COMPREHENSIVE METABOLIC PANEL
ALT: 18 IU/L (ref 0–32)
AST: 22 IU/L (ref 0–40)
Albumin/Globulin Ratio: 1.4 (ref 1.2–2.2)
Albumin: 4.3 g/dL (ref 3.6–4.6)
Alkaline Phosphatase: 102 IU/L (ref 44–121)
BUN/Creatinine Ratio: 13 (ref 12–28)
BUN: 12 mg/dL (ref 8–27)
Bilirubin Total: 0.4 mg/dL (ref 0.0–1.2)
CO2: 23 mmol/L (ref 20–29)
Calcium: 9.7 mg/dL (ref 8.7–10.3)
Chloride: 106 mmol/L (ref 96–106)
Creatinine, Ser: 0.9 mg/dL (ref 0.57–1.00)
Globulin, Total: 3 g/dL (ref 1.5–4.5)
Glucose: 105 mg/dL — ABNORMAL HIGH (ref 70–99)
Potassium: 4.8 mmol/L (ref 3.5–5.2)
Sodium: 143 mmol/L (ref 134–144)
Total Protein: 7.3 g/dL (ref 6.0–8.5)
eGFR: 63 mL/min/{1.73_m2} (ref >59)

## 2022-02-21 LAB — CBC WITH DIFFERENTIAL/PLATELET
Basophils Absolute: 0.1 10*3/uL (ref 0.0–0.2)
Basos: 1 %
EOS (ABSOLUTE): 0.3 10*3/uL (ref 0.0–0.4)
Eos: 5 %
Hematocrit: 42.3 % (ref 34.0–46.6)
Hemoglobin: 13.3 g/dL (ref 11.1–15.9)
Immature Grans (Abs): 0 10*3/uL (ref 0.0–0.1)
Immature Granulocytes: 1 %
Lymphocytes Absolute: 0.9 10*3/uL (ref 0.7–3.1)
Lymphs: 14 %
MCH: 26.8 pg (ref 26.6–33.0)
MCHC: 31.4 g/dL — ABNORMAL LOW (ref 31.5–35.7)
MCV: 85 fL (ref 79–97)
Monocytes Absolute: 0.8 10*3/uL (ref 0.1–0.9)
Monocytes: 12 %
Neutrophils Absolute: 4.4 10*3/uL (ref 1.4–7.0)
Neutrophils: 67 %
Platelets: 264 10*3/uL (ref 150–450)
RBC: 4.96 x10E6/uL (ref 3.77–5.28)
RDW: 14.2 % (ref 11.7–15.4)
WBC: 6.6 10*3/uL (ref 3.4–10.8)

## 2022-02-21 LAB — LIPID PANEL
Chol/HDL Ratio: 3.1 ratio (ref 0.0–4.4)
Cholesterol, Total: 183 mg/dL (ref 100–199)
HDL: 60 mg/dL (ref >39)
LDL Chol Calc (NIH): 97 mg/dL (ref 0–99)
Triglycerides: 149 mg/dL (ref 0–149)
VLDL Cholesterol Cal: 26 mg/dL (ref 5–40)

## 2022-03-03 LAB — HGB A1C W/O EAG: Hgb A1c MFr Bld: 6.4 % — ABNORMAL HIGH (ref 4.8–5.6)

## 2022-03-03 LAB — SPECIMEN STATUS REPORT

## 2022-03-06 ENCOUNTER — Telehealth (INDEPENDENT_AMBULATORY_CARE_PROVIDER_SITE_OTHER): Payer: Medicare Other | Admitting: Family Medicine

## 2022-03-06 ENCOUNTER — Encounter: Payer: Self-pay | Admitting: Family Medicine

## 2022-03-06 VITALS — Ht 65.5 in | Wt 180.0 lb

## 2022-03-06 DIAGNOSIS — R7303 Prediabetes: Secondary | ICD-10-CM

## 2022-03-06 NOTE — Progress Notes (Signed)
? ?  Subjective:  ? ? Patient ID: Katherine Mccormick, female    DOB: 1934/06/15, 88 y.o.   MRN: [id number] ? ?HPI ?Documentation for virtual audio  telecommunications .  We were unable to connect her to caregility. ?The patient was located at home. 2 patient identifiers used.  ?The provider was located in the office. ?The patient did consent to this visit and is aware of possible charges through their insurance for this visit. ?The other persons participating in this telemedicine service were none. ?Time spent on call was 5 minutes and in review of previous records >15 minutes total for counseling and coordination of care. ?This virtual service is not related to other E/M service within previous 7 days.  ?She recently had blood works which is show slightly elevated glucose and subsequent hemoglobin A1c of 6.4. ? ? ?Review of Systems ? ?   ?Objective:  ? Physical Exam ?Alert and in no distress otherwise not examined ?Hemoglobin A1c is 6.4 ? ? ?   ?Assessment & Plan:  ?Prediabetes ?I discussed the diagnosis of diabetes as well as prediabetes in regard to hemoglobin A1c.  She is at 6.4 and if her right on the edge.  Recommended 20 minutes of something physical daily as well as cutting back on carbohydrates.  Mentioned bread, rice, pasta, potatoes and sugar is easy ways to remember and did recommend cutting the use of these in half.  We will follow-up on all these with her next office visit. ? ?

## 2022-04-08 ENCOUNTER — Ambulatory Visit
Admission: RE | Admit: 2022-04-08 | Discharge: 2022-04-08 | Disposition: A | Discharge status: Home / Self Care | Payer: Medicare Other | Source: Ambulatory Visit | Attending: Family Medicine | Admitting: Family Medicine

## 2022-04-08 ENCOUNTER — Encounter: Payer: Self-pay | Admitting: Family Medicine

## 2022-04-08 ENCOUNTER — Ambulatory Visit: Payer: Medicare Other | Admitting: Family Medicine

## 2022-04-08 ENCOUNTER — Ambulatory Visit (INDEPENDENT_AMBULATORY_CARE_PROVIDER_SITE_OTHER): Payer: Medicare Other | Admitting: Family Medicine

## 2022-04-08 VITALS — BP 162/100 | HR 67 | Temp 97.3°F | Wt 180.6 lb

## 2022-04-08 DIAGNOSIS — M545 Low back pain, unspecified: Secondary | ICD-10-CM

## 2022-04-08 MED ORDER — TRAMADOL HCL 50 MG PO TABS: 50.0000 mg | ORAL_TABLET | Freq: Three times a day (TID) | ORAL | 0 refills | Status: AC | PRN

## 2022-04-08 NOTE — Progress Notes (Signed)
   Subjective:    Patient ID: Katherine Mccormick, female    DOB: 05-Jan-1934, 88 y.o.   MRN: [id number]  HPI She complains of a 3-day history of left-sided low back pain.  She did do some yard work a couple days prior to that and thinks that might started with that.  Pain is worse with walking and does complain of radiation down her left leg.  She notes that when she flexes her back, the pain is relieved slightly.   Review of Systems     Objective:   Physical Exam Alert and noted to be splinting when she attempted to stand up.  Pain on motion of the back with pain on palpation over the left SI joint.  Hip motion is normal.  Straight leg raising was positive on the left at approximately 45 degrees with questionable positive sciatic stretch.  She complains of decreased sensation to the whole leg.       Assessment & Plan:  Acute left-sided low back pain, unspecified whether sciatica present - Plan: traMADol (ULTRAM) 50 MG tablet, DG Lumbar Spine Complete She states that Tylenol causes GI irritation as does codeine.  I will have her take 2 Aleve twice per day and I will give tramadol for extra pain relief.  X-rays pending.  Symptoms are more towards SI joint dysfunction than radicular pain.  Recheck here in 1 week.

## 2022-04-08 NOTE — Patient Instructions (Signed)
Take 2 Aleve twice per day for your back pain and you can also take the medicine called tramadol and you can take that 3 times per day if need it.  Go ahead and get the x-ray and the need to follow-up Try heat to your back for 20 minutes 3 times per day with some gentle stretching after

## 2022-04-16 ENCOUNTER — Ambulatory Visit (INDEPENDENT_AMBULATORY_CARE_PROVIDER_SITE_OTHER): Payer: Medicare Other | Admitting: Family Medicine

## 2022-04-16 ENCOUNTER — Telehealth: Payer: Self-pay | Admitting: Family Medicine

## 2022-04-16 ENCOUNTER — Encounter: Payer: Self-pay | Admitting: Family Medicine

## 2022-04-16 VITALS — BP 144/86 | HR 83 | Temp 98.1°F | Wt 177.0 lb

## 2022-04-16 DIAGNOSIS — M461 Sacroiliitis, not elsewhere classified: Secondary | ICD-10-CM | POA: Diagnosis not present

## 2022-04-16 MED ORDER — MELOXICAM 15 MG PO TABS: 15.0000 mg | ORAL_TABLET | Freq: Every day | ORAL | 0 refills | Status: AC

## 2022-04-16 NOTE — Patient Instructions (Addendum)
Stop the Aleve and see if you can tolerate taking Tylenol.  You can take 2 Tylenol 4 times per day for the pain as well as tramadol and try my new medication.  Heat for 20 minutes 3 times per day.

## 2022-04-16 NOTE — Telephone Encounter (Signed)
Pt husband called and states that pt got a appt with dr [name] in 2 weeks but he dont want her to wait that long states she was in to much pain he wants to know if you can get her in any sooner or in another dr office   She can be reached at [phone]

## 2022-04-16 NOTE — Progress Notes (Signed)
   Subjective:    Patient ID: SABREEN KITCHEN, female    DOB: 26-Oct-1934, 88 y.o.   MRN: [id number]  HPI She is here for recheck on her back pain.  She has been on 2 Aleve twice per day and tramadol which makes her drowsy.  She states that her pain is only an 8 out of 10.  She does complain of a burning sensation down her legs which is interfered with sleep.   Review of Systems     Objective:   Physical Exam Splinting noted with any motion.  Tender to palpation over the left SI joint.  Provocative testing was positive.  Negative straight leg raising.  Normal DTRs.       Assessment & Plan:  Sacroiliitis (HCC) - Plan: meloxicam (MOBIC) 15 MG tablet, Ambulatory referral to Orthopedic Surgery With this exam is a little more definitive for this being SI joint dysfunction with no radicular symptoms.  She does not seem to be responding to conservative care and I will therefore refer to orthopedics.  She has seen Dr. [name] in the past.  I will defer to his judgment as to whether an injection would be beneficial.

## 2022-04-17 ENCOUNTER — Other Ambulatory Visit: Payer: Self-pay

## 2022-04-17 ENCOUNTER — Ambulatory Visit (INDEPENDENT_AMBULATORY_CARE_PROVIDER_SITE_OTHER): Payer: Medicare Other | Admitting: Family Medicine

## 2022-04-17 ENCOUNTER — Encounter (HOSPITAL_COMMUNITY): Payer: Self-pay

## 2022-04-17 ENCOUNTER — Emergency Department (HOSPITAL_COMMUNITY): Payer: Medicare Other

## 2022-04-17 ENCOUNTER — Emergency Department (HOSPITAL_COMMUNITY)
Admission: EM | Admit: 2022-04-17 | Discharge: 2022-04-18 | Disposition: A | Discharge status: Home / Self Care | Payer: Medicare Other | Attending: Emergency Medicine | Admitting: Emergency Medicine

## 2022-04-17 VITALS — BP 136/86 | HR 59

## 2022-04-17 DIAGNOSIS — K449 Diaphragmatic hernia without obstruction or gangrene: Secondary | ICD-10-CM | POA: Diagnosis not present

## 2022-04-17 DIAGNOSIS — R1032 Left lower quadrant pain: Secondary | ICD-10-CM | POA: Diagnosis not present

## 2022-04-17 DIAGNOSIS — R109 Unspecified abdominal pain: Secondary | ICD-10-CM | POA: Diagnosis not present

## 2022-04-17 DIAGNOSIS — M461 Sacroiliitis, not elsewhere classified: Secondary | ICD-10-CM | POA: Diagnosis not present

## 2022-04-17 DIAGNOSIS — M4316 Spondylolisthesis, lumbar region: Secondary | ICD-10-CM | POA: Diagnosis not present

## 2022-04-17 DIAGNOSIS — Z91040 Latex allergy status: Secondary | ICD-10-CM | POA: Insufficient documentation

## 2022-04-17 DIAGNOSIS — M5442 Lumbago with sciatica, left side: Secondary | ICD-10-CM

## 2022-04-17 DIAGNOSIS — M545 Low back pain, unspecified: Secondary | ICD-10-CM | POA: Diagnosis not present

## 2022-04-17 DIAGNOSIS — J9811 Atelectasis: Secondary | ICD-10-CM | POA: Diagnosis not present

## 2022-04-17 MED ORDER — HYDROCODONE-ACETAMINOPHEN 5-325 MG PO TABS: 1.0000 | ORAL_TABLET | Freq: Once | ORAL | Status: DC

## 2022-04-17 MED ORDER — KETOROLAC TROMETHAMINE 60 MG/2ML IM SOLN
60.0000 mg | Freq: Once | INTRAMUSCULAR | Status: AC
  Administered 2022-04-17: 60 mg via INTRAMUSCULAR

## 2022-04-17 NOTE — Progress Notes (Signed)
   Subjective:    Patient ID: Katherine Mccormick, female    DOB: 1934/04/18, 88 y.o.   MRN: [id number]  HPI She is here for recheck concerning her right SI joint pain.  She has been taking tramadol but it tends to knock her out.  She has reactions to codeine, morphine, Demerol causing GI irritation and can therefore not take that.  I wrote for meloxicam however she has not gotten it filled yet. Review of Systems     Objective:   Physical Exam Alert and in acute left-sided pain with tenderness over the SI joint area.  previous x-rays showed degenerative changes.       Assessment & Plan:  Sacroiliitis (HCC) She is having more pain than I would expect from sacroiliitis and while in the office she became quite agitated complaining of worsening pain with no physical activity.  I am going to send her to the ER for further evaluation.

## 2022-04-17 NOTE — ED Provider Triage Note (Signed)
Emergency Medicine Provider Triage Evaluation Note  Katherine Mccormick , a 88 y.o. female  was evaluated in triage.  Pt complains of lower back pain along the left lower lumbar region.  Pain radiates down the entire left leg.  Denies any weakness or numbness to lower extremities.  No bowel or bladder incontinence.  Patient was seen evaluated at her PCP today and was given a shot of Toradol and sent to the emergency department.  Review of Systems  Positive:  Negative: See above  Physical Exam  BP (!) 119/101 (BP Location: Right Arm)   Pulse 90   Temp 99.2 F (37.3 C) (Oral)   Resp 18   Ht 5' 5.5" (1.664 m)   Wt 80.3 kg   SpO2 99%   BMI 29.01 kg/m  Gen:   Awake, no distress   Resp:  Normal effort  MSK:   Moves extremities without difficulty  Other:  There is point tenderness over the left lower lumbar region.  No midline lumbar tenderness.  Medical Decision Making  Medically screening exam initiated at 4:23 PM.  Appropriate orders placed.  Katherine Mccormick was informed that the remainder of the evaluation will be completed by another provider, this initial triage assessment does not replace that evaluation, and the importance of remaining in the ED until their evaluation is complete.     Honor Loh [place], [place] 04/17/22 1624

## 2022-04-18 ENCOUNTER — Emergency Department (HOSPITAL_COMMUNITY): Payer: Medicare Other

## 2022-04-18 DIAGNOSIS — J9811 Atelectasis: Secondary | ICD-10-CM | POA: Diagnosis not present

## 2022-04-18 DIAGNOSIS — K449 Diaphragmatic hernia without obstruction or gangrene: Secondary | ICD-10-CM | POA: Diagnosis not present

## 2022-04-18 DIAGNOSIS — R109 Unspecified abdominal pain: Secondary | ICD-10-CM | POA: Diagnosis not present

## 2022-04-18 LAB — CBC WITH DIFFERENTIAL/PLATELET
Abs Immature Granulocytes: 0.04 10*3/uL (ref 0.00–0.07)
Basophils Absolute: 0.1 10*3/uL (ref 0.0–0.1)
Basophils Relative: 1 %
Eosinophils Absolute: 0.1 10*3/uL (ref 0.0–0.5)
Eosinophils Relative: 1 %
HCT: 45.9 % (ref 36.0–46.0)
Hemoglobin: 14.2 g/dL (ref 12.0–15.0)
Immature Granulocytes: 0 %
Lymphocytes Relative: 11 %
Lymphs Abs: 1.2 10*3/uL (ref 0.7–4.0)
MCH: 26.6 pg (ref 26.0–34.0)
MCHC: 30.9 g/dL (ref 30.0–36.0)
MCV: 86.1 fL (ref 80.0–100.0)
Monocytes Absolute: 2 10*3/uL — ABNORMAL HIGH (ref 0.1–1.0)
Monocytes Relative: 18 %
Neutro Abs: 7.5 10*3/uL (ref 1.7–7.7)
Neutrophils Relative %: 69 %
Platelets: 264 10*3/uL (ref 150–400)
RBC: 5.33 MIL/uL — ABNORMAL HIGH (ref 3.87–5.11)
RDW: 14.8 % (ref 11.5–15.5)
WBC: 10.9 10*3/uL — ABNORMAL HIGH (ref 4.0–10.5)
nRBC: 0 % (ref 0.0–0.2)

## 2022-04-18 LAB — BASIC METABOLIC PANEL
Anion gap: 11 (ref 5–15)
BUN: 17 mg/dL (ref 8–23)
CO2: 26 mmol/L (ref 22–32)
Calcium: 9.8 mg/dL (ref 8.9–10.3)
Chloride: 103 mmol/L (ref 98–111)
Creatinine, Ser: 1.26 mg/dL — ABNORMAL HIGH (ref 0.44–1.00)
GFR, Estimated: 42 mL/min — ABNORMAL LOW (ref >60)
Glucose, Bld: 120 mg/dL — ABNORMAL HIGH (ref 70–99)
Potassium: 3.7 mmol/L (ref 3.5–5.1)
Sodium: 140 mmol/L (ref 135–145)

## 2022-04-18 MED ORDER — IOHEXOL 350 MG/ML SOLN
75.0000 mL | Freq: Once | INTRAVENOUS | Status: AC | PRN
  Administered 2022-04-18: 75 mL via INTRAVENOUS

## 2022-04-18 MED ORDER — ONDANSETRON 8 MG PO TBDP: 8.0000 mg | ORAL_TABLET | Freq: Three times a day (TID) | ORAL | 0 refills | Status: AC | PRN

## 2022-04-18 MED ORDER — HYDROCODONE-ACETAMINOPHEN 5-325 MG PO TABS
1.0000 | ORAL_TABLET | ORAL | 0 refills | Status: AC | PRN
Start: 2022-04-18 — End: ?

## 2022-04-18 MED ORDER — FENTANYL CITRATE PF 50 MCG/ML IJ SOSY
50.0000 ug | PREFILLED_SYRINGE | Freq: Once | INTRAMUSCULAR | Status: AC
  Administered 2022-04-18: 50 ug via INTRAVENOUS
  Filled 2022-04-18: qty 1

## 2022-04-18 MED ORDER — MORPHINE SULFATE (PF) 4 MG/ML IV SOLN
4.0000 mg | Freq: Once | INTRAVENOUS | Status: DC
  Filled 2022-04-18: qty 1

## 2022-04-20 ENCOUNTER — Telehealth: Payer: Self-pay

## 2022-04-20 NOTE — Telephone Encounter (Signed)
Transition Care Management Unsuccessful Follow-up Telephone Call  Date of discharge and from where:  Katherine Mccormick 04/18/22  Attempts:  1st Attempt  Reason for unsuccessful TCM follow-up call:  No answer/busy

## 2022-04-23 ENCOUNTER — Encounter: Payer: Self-pay | Admitting: Surgery

## 2022-04-23 ENCOUNTER — Ambulatory Visit: Payer: Medicare Other | Admitting: Surgery

## 2022-04-23 VITALS — BP 150/86

## 2022-04-23 DIAGNOSIS — M4316 Spondylolisthesis, lumbar region: Secondary | ICD-10-CM | POA: Diagnosis not present

## 2022-04-23 DIAGNOSIS — M5416 Radiculopathy, lumbar region: Secondary | ICD-10-CM | POA: Diagnosis not present

## 2022-04-23 NOTE — Progress Notes (Signed)
Office Visit Note   Patient: Katherine Mccormick           Date of Birth: 1934-01-20           MRN: [id number] Visit Date: 04/23/2022              Requested by: [name], MD [address] [place],  [place] [address] PCP: [name], MD   Assessment & Plan: Visit Diagnoses:  1. Radiculopathy, lumbar region   2. Spondylolisthesis, lumbar region     Plan: I was going to send in a prescription for Medrol Dosepak 6-day taper but there was an alert for allergic reaction to dexamethasone in the past.  I will go ahead and proceed with getting lumbar MRI to rule out HNP/stenosis.  Patient does have multilevel lumbar spondylosis with grade 1 L4-5 spondylolisthesis.  Follow-up with Dr. [name] in 3 weeks to discuss results and further treatment options.  Follow-Up Instructions: Return in about 3 weeks (around 05/14/2022) for with dr yates recheck lumbar.   Orders:  No orders of the defined types were placed in this encounter.  No orders of the defined types were placed in this encounter.     Procedures: No procedures performed   Clinical Data: No additional findings.   Subjective: Chief Complaint  Patient presents with   Lower Back - Pain    HPI 88 year old black female comes in today with complaints of ongoing low back pain and left lower extremity radiculopathy.  Patient has been followed by her primary care provider for this and has failed conservative management.  She also recently went to the emergency department April 17, 2022 for the same complaint and lumbar spine x-rays showed:  CLINICAL DATA:  Lower back pain.   EXAM: LUMBAR SPINE - COMPLETE 4+ VIEW   COMPARISON:  Lumbar spine radiographs 04/08/2022   FINDINGS: There is diffuse decreased bone mineralization. There are 5 non-rib-bearing lumbar-type vertebral bodies. Mild levocurvature centered at L1. Mild right L1-2 and moderate to severe left L4-5 disc space narrowing on frontal view.   Unchanged  approximate 6 mm grade 1 anterolisthesis of L4 on L5.   Vertebral body heights are maintained.   On lateral view there is mild-to-moderate anterior T11-12 and moderate to severe diffuse L4-5 and moderate L5-S1 disc space narrowing, unchanged. No acute fracture is seen. No dislocation.   IMPRESSION: No significant change from prior. Mild levocurvature centered at L1.   Grade 1 anterolisthesis of L4 on L5.   Moderate to severe L4-5 and moderate L5-S1 degenerative disc changes.     Electronically Signed   By: [name] M.D.   On: 04/17/2022 16:56  Patient planes of pain running down to the left thigh and down to her ankle.  Numbness and tingling in the left leg as well.  No right leg symptoms.  States that walking distances have greatly decreased over the last several months.  She has had medication management with Tylenol, Mobic, hydrocodone with no improvement.   Review of Systems No current cardiopulmonary GI/GU issues  Objective: Vital Signs: BP (!) 150/86   Physical Exam Constitutional:      Appearance: Normal appearance.  HENT:     Head: Normocephalic and atraumatic.  Eyes:     Extraocular Movements: Extraocular movements intact.  Musculoskeletal:     Comments: Gait is antalgic with a single prong cane.  Moderate left-sided lumbar paraspinal tenderness/spasm.  Negative log roll bilateral hips.  Positive left straight leg raise.  No  focal motor deficits.  Neurological:     Mental Status: She is alert.  Psychiatric:        Mood and Affect: Mood normal.     Ortho Exam  Specialty Comments:  No specialty comments available.  Imaging: No results found.   PMFS History: Patient Active Problem List   Diagnosis Date Noted   Seborrheic keratosis 02/14/2021   S/P cataract surgery, right 02/14/2021   Arthritis of low back 02/14/2021   Elevated blood pressure reading 02/14/2021   Neuritis of left sural nerve 08/14/2014   No past medical history on file.  No  family history on file.  Past Surgical History:  Procedure Laterality Date   ABDOMINAL HYSTERECTOMY     BACK SURGERY     Social History   Occupational History   Not on file  Tobacco Use   Smoking status: Never   Smokeless tobacco: Never  Substance and Sexual Activity   Alcohol use: No   Drug use: No   Sexual activity: Not Currently

## 2022-04-24 ENCOUNTER — Telehealth: Payer: Self-pay | Admitting: Surgery

## 2022-04-24 NOTE — Telephone Encounter (Signed)
Tried calling patient back, but no answer and VM set up to leave a message.

## 2022-04-24 NOTE — Telephone Encounter (Signed)
Pt called requesting a call back from [place] concerning her medication prescribed. Please call this pt at [phone]

## 2022-05-07 ENCOUNTER — Ambulatory Visit (INDEPENDENT_AMBULATORY_CARE_PROVIDER_SITE_OTHER): Payer: Medicare Other | Admitting: Family Medicine

## 2022-05-07 ENCOUNTER — Telehealth: Payer: Self-pay

## 2022-05-07 VITALS — BP 150/90 | HR 71 | Temp 97.0°F | Wt 177.0 lb

## 2022-05-07 DIAGNOSIS — M47816 Spondylosis without myelopathy or radiculopathy, lumbar region: Secondary | ICD-10-CM

## 2022-05-07 DIAGNOSIS — M461 Sacroiliitis, not elsewhere classified: Secondary | ICD-10-CM

## 2022-05-07 DIAGNOSIS — I700 Atherosclerosis of aorta: Secondary | ICD-10-CM

## 2022-05-07 DIAGNOSIS — M4310 Spondylolisthesis, site unspecified: Secondary | ICD-10-CM

## 2022-05-07 MED ORDER — PREDNISONE 10 MG (21) PO TBPK: ORAL_TABLET | ORAL | 0 refills | Status: AC

## 2022-05-07 NOTE — Progress Notes (Signed)
   Subjective:    Patient ID: Katherine Mccormick, female    DOB: 08-21-1934, 88 y.o.   MRN: [id number]  HPI She is here for a recheck.  She has been seen by orthopedics on June 30.  That encounter was evaluated.  Apparently an MRI was ordered however I do not see it in the system yet.  She now states that she is having some right-sided discomfort as well.   Review of Systems     Objective:   Physical Exam Alert and complaining of back pain.  Hip motion is normal bilaterally.  Negative straight leg raising.  DTRs are slightly diminished. Previous x-rays were evaluated and it did show evidence of aortic atherosclerosis as well as multilevel spondylosis and spondylolisthesis      Assessment & Plan:  Sacroiliitis (HCC) - Plan: predniSONE (STERAPRED UNI-PAK 21 TAB) 10 MG (21) TBPK tablet  Lumbar spondylosis - Plan: predniSONE (STERAPRED UNI-PAK 21 TAB) 10 MG (21) TBPK tablet  Aortic atherosclerosis (HCC) I do not see the MRI results and I will therefore follow-up on that.

## 2022-05-07 NOTE — Telephone Encounter (Signed)
Called pt to advised that [facility] has been trying to reach her to schedule her MRI. She can call their office back to schedule ( [phone] opt.1.    KH

## 2022-05-19 ENCOUNTER — Ambulatory Visit: Payer: Medicare Other | Admitting: Orthopaedic Surgery

## 2022-05-21 ENCOUNTER — Ambulatory Visit
Admission: RE | Admit: 2022-05-21 | Discharge: 2022-05-21 | Disposition: A | Discharge status: Home / Self Care | Payer: Medicare Other | Source: Ambulatory Visit | Attending: Surgery | Admitting: Surgery

## 2022-05-21 DIAGNOSIS — M4316 Spondylolisthesis, lumbar region: Secondary | ICD-10-CM

## 2022-05-21 DIAGNOSIS — M48061 Spinal stenosis, lumbar region without neurogenic claudication: Secondary | ICD-10-CM | POA: Diagnosis not present

## 2022-05-21 DIAGNOSIS — M5416 Radiculopathy, lumbar region: Secondary | ICD-10-CM

## 2022-05-21 DIAGNOSIS — M4807 Spinal stenosis, lumbosacral region: Secondary | ICD-10-CM | POA: Diagnosis not present

## 2022-05-21 DIAGNOSIS — M5127 Other intervertebral disc displacement, lumbosacral region: Secondary | ICD-10-CM | POA: Diagnosis not present

## 2022-05-21 DIAGNOSIS — M5126 Other intervertebral disc displacement, lumbar region: Secondary | ICD-10-CM | POA: Diagnosis not present

## 2022-05-21 MED ORDER — GADOBENATE DIMEGLUMINE 529 MG/ML IV SOLN
15.0000 mL | Freq: Once | INTRAVENOUS | Status: AC | PRN
  Administered 2022-05-21: 15 mL via INTRAVENOUS

## 2022-06-03 ENCOUNTER — Encounter: Payer: Self-pay | Admitting: Orthopaedic Surgery

## 2022-06-03 ENCOUNTER — Ambulatory Visit: Payer: Medicare Other | Admitting: Orthopaedic Surgery

## 2022-06-03 DIAGNOSIS — M5126 Other intervertebral disc displacement, lumbar region: Secondary | ICD-10-CM

## 2022-06-03 NOTE — Progress Notes (Signed)
Office Visit Note   Patient: Katherine Mccormick           Date of Birth: 1934/07/16           MRN: [id number] Visit Date: 06/03/2022              Requested by: [name], MD [address] [place],  [place] [address] PCP: [name], MD   Assessment & Plan: Visit Diagnoses:  1. Protrusion of lumbar intervertebral disc     Plan: Reviewed patient's MRI she is gotten better and is more mobile more comfortable and left leg symptoms have improved.  We reviewed the extruded disc fragment on the left at L3-4.  If she develops recurrent increased problems we can consider epidural injection at that level.  Fall prevention discussed.  She is not currently having claudication symptoms.  Follow-Up Instructions: No follow-ups on file.   Orders:  No orders of the defined types were placed in this encounter.  No orders of the defined types were placed in this encounter.     Procedures: No procedures performed   Clinical Data: No additional findings.   Subjective: Chief Complaint  Patient presents with   Lower Back - Follow-up    HPI 88 year old female returns states that her back is actually better probably after the prednisone Dosepak.  She is using Tylenol rarely tramadol some intermittent ice.  She ambulates with a cane.  She uses the rollator when she goes to the grocery store or has to lean over a grocery cart.  She continues to walk in the community.  Previous back surgery by Dr. Shelle Iron about 6 years ago.  MRI scan has been obtained and is reviewed with her today.  Denies bowel bladder symptoms no fever or chills.  Review of Systemsall other systems noncontributory to HPI.   Objective: Vital Signs: BP (!) 164/86   Pulse 69   Ht 5\' 5"  (1.651 m)   Wt 177 lb (80.3 kg)   BMI 29.45 kg/m   Physical Exam Constitutional:      Appearance: She is well-developed.  HENT:     Head: Normocephalic.     Right Ear: External ear normal.     Left Ear: External ear normal.  There is no impacted cerumen.  Eyes:     Pupils: Pupils are equal, round, and reactive to light.  Neck:     Thyroid: No thyromegaly.     Trachea: No tracheal deviation.  Cardiovascular:     Rate and Rhythm: Normal rate.  Pulmonary:     Effort: Pulmonary effort is normal.  Abdominal:     Palpations: Abdomen is soft.  Musculoskeletal:     Cervical back: No rigidity.  Skin:    General: Skin is warm and dry.  Neurological:     Mental Status: She is alert and oriented to person, place, and time.  Psychiatric:        Behavior: Behavior normal.     Ortho Exam healed lumbar incision she ambulates well with a cane.  Some decreased sensation anteriorly over the left normal on the right.  Specialty Comments:  No specialty comments available.  Imaging: Narrative & Impression  CLINICAL DATA:  Low back pain left leg pain. Back surgery 6 years ago.   EXAM: MRI LUMBAR SPINE WITHOUT AND WITH CONTRAST   TECHNIQUE: Multiplanar and multiecho pulse sequences of the lumbar spine were obtained without and with intravenous contrast.   CONTRAST:  90mL MULTIHANCE GADOBENATE DIMEGLUMINE 529  MG/ML IV SOLN   COMPARISON:  Lumbar radiographs 04/18/2019.   FINDINGS: Segmentation:  5 lumbar vertebra   Alignment:  Mild scoliosis.  5 mm anterolisthesis L4-5.   Vertebrae:  Negative for fracture or mass.   Hemangioma L1 vertebral body.   Conus medullaris and cauda equina: Conus extends to the L1 level. Conus and cauda equina appear normal.   Paraspinal and other soft tissues: Negative for paraspinous mass or adenopathy   Disc levels:   T12-L1: Mild disc degeneration. Small left paracentral disc protrusion. Negative for stenosis   L1-2: Mild disc bulging and mild facet degeneration. Negative for stenosis   L2-3: Mild disc degeneration disc bulging. Mild to moderate facet hypertrophy. Mild spinal stenosis.   L3-4: Prior bilateral laminectomy. Moderate to advanced facet hypertrophy  bilaterally. Extruded disc fragment on the left with upgoing disc material. Correlate with left L3 nerve root symptoms. Central canal patent.   L4-5: 5 mm anterolisthesis with moderate facet degeneration. Bilateral laminectomy. Subarticular stenosis bilaterally. Central canal patent   L5-S1: Shallow broad-based disc protrusion and bilateral facet hypertrophy. Mild central canal stenosis. Moderate subarticular and foraminal stenosis bilaterally.   IMPRESSION: Mild spinal stenosis L2-3   Prior laminectomy L3-4. Extruded disc fragment on the left L3-4 the balloon disc material. Correlate with left L3 nerve root symptoms   Bilateral laminectomy L4-5.  Subarticular stenosis bilaterally   Mild central canal stenosis L5-S1 with moderate subarticular and foraminal stenosis bilaterally at L5-S1.     Electronically Signed   By: Marlan Palau M.D.   On: 05/21/2022 15:05       PMFS History: Patient Active Problem List   Diagnosis Date Noted   Protrusion of lumbar intervertebral disc 06/03/2022   Aortic atherosclerosis (HCC) 05/07/2022   Seborrheic keratosis 02/14/2021   S/P cataract surgery, right 02/14/2021   Arthritis of low back 02/14/2021   Elevated blood pressure reading 02/14/2021   Neuritis of left sural nerve 08/14/2014   No past medical history on file.  No family history on file.  Past Surgical History:  Procedure Laterality Date   ABDOMINAL HYSTERECTOMY     BACK SURGERY     Social History   Occupational History   Not on file  Tobacco Use   Smoking status: Never   Smokeless tobacco: Never  Substance and Sexual Activity   Alcohol use: No   Drug use: No   Sexual activity: Not Currently

## 2022-06-12 DIAGNOSIS — H401131 Primary open-angle glaucoma, bilateral, mild stage: Secondary | ICD-10-CM | POA: Diagnosis not present

## 2022-06-12 DIAGNOSIS — H25012 Cortical age-related cataract, left eye: Secondary | ICD-10-CM | POA: Diagnosis not present

## 2022-06-12 DIAGNOSIS — H2512 Age-related nuclear cataract, left eye: Secondary | ICD-10-CM | POA: Diagnosis not present

## 2022-10-15 ENCOUNTER — Ambulatory Visit
Admission: RE | Admit: 2022-10-15 | Discharge: 2022-10-15 | Disposition: A | Discharge status: Home / Self Care | Payer: Medicare Other | Source: Ambulatory Visit | Attending: Family Medicine | Admitting: Family Medicine

## 2022-10-15 DIAGNOSIS — M85852 Other specified disorders of bone density and structure, left thigh: Secondary | ICD-10-CM | POA: Diagnosis not present

## 2022-10-15 DIAGNOSIS — Z780 Asymptomatic menopausal state: Secondary | ICD-10-CM | POA: Diagnosis not present

## 2022-10-16 ENCOUNTER — Telehealth: Payer: Self-pay

## 2022-10-16 NOTE — Telephone Encounter (Signed)
-----   Message from [name], MD sent at 10/15/2022  8:25 PM EST ----- Let her know that the test indicates she is osteopenic meaning she needs to make sure that she takes vitamin D and calcium

## 2022-10-16 NOTE — Telephone Encounter (Signed)
Patient advised of Dr. Jola Babinski message below. Patient is not currently taking any Vitamin D or Calcium supplements. She wants to know which dose would you like her to take?

## 2022-10-20 NOTE — Telephone Encounter (Signed)
Patient advised.

## 2023-01-04 DIAGNOSIS — H401131 Primary open-angle glaucoma, bilateral, mild stage: Secondary | ICD-10-CM | POA: Diagnosis not present

## 2023-02-23 DIAGNOSIS — J309 Allergic rhinitis, unspecified: Secondary | ICD-10-CM | POA: Insufficient documentation

## 2023-02-24 ENCOUNTER — Ambulatory Visit: Payer: Medicare Other | Admitting: Family Medicine

## 2023-02-24 DIAGNOSIS — J309 Allergic rhinitis, unspecified: Secondary | ICD-10-CM

## 2023-02-24 DIAGNOSIS — M47819 Spondylosis without myelopathy or radiculopathy, site unspecified: Secondary | ICD-10-CM

## 2023-02-24 DIAGNOSIS — I700 Atherosclerosis of aorta: Secondary | ICD-10-CM

## 2023-02-24 DIAGNOSIS — R030 Elevated blood-pressure reading, without diagnosis of hypertension: Secondary | ICD-10-CM

## 2023-02-24 DIAGNOSIS — Z9841 Cataract extraction status, right eye: Secondary | ICD-10-CM

## 2023-03-04 ENCOUNTER — Encounter: Payer: Self-pay | Admitting: Family Medicine

## 2023-07-02 DIAGNOSIS — H2512 Age-related nuclear cataract, left eye: Secondary | ICD-10-CM | POA: Diagnosis not present

## 2023-07-02 DIAGNOSIS — H52203 Unspecified astigmatism, bilateral: Secondary | ICD-10-CM | POA: Diagnosis not present

## 2023-07-02 DIAGNOSIS — H401131 Primary open-angle glaucoma, bilateral, mild stage: Secondary | ICD-10-CM | POA: Diagnosis not present

## 2023-07-02 DIAGNOSIS — H25012 Cortical age-related cataract, left eye: Secondary | ICD-10-CM | POA: Diagnosis not present

## 2023-07-02 DIAGNOSIS — Z961 Presence of intraocular lens: Secondary | ICD-10-CM | POA: Diagnosis not present

## 2023-07-02 DIAGNOSIS — H524 Presbyopia: Secondary | ICD-10-CM | POA: Diagnosis not present

## 2023-07-06 ENCOUNTER — Ambulatory Visit (INDEPENDENT_AMBULATORY_CARE_PROVIDER_SITE_OTHER): Payer: Medicare Other

## 2023-07-06 ENCOUNTER — Encounter: Payer: Self-pay | Admitting: Family Medicine

## 2023-07-06 ENCOUNTER — Ambulatory Visit (INDEPENDENT_AMBULATORY_CARE_PROVIDER_SITE_OTHER): Payer: Medicare Other | Admitting: Family Medicine

## 2023-07-06 ENCOUNTER — Ambulatory Visit
Admission: RE | Admit: 2023-07-06 | Discharge: 2023-07-06 | Disposition: A | Discharge status: Home / Self Care | Payer: Medicare Other | Source: Ambulatory Visit | Attending: Family Medicine | Admitting: Family Medicine

## 2023-07-06 VITALS — BP 130/80 | HR 68 | Wt 170.8 lb

## 2023-07-06 VITALS — BP 132/80 | HR 80 | Temp 98.2°F | Ht 64.0 in | Wt 170.6 lb

## 2023-07-06 DIAGNOSIS — Z9841 Cataract extraction status, right eye: Secondary | ICD-10-CM

## 2023-07-06 DIAGNOSIS — Z2821 Immunization not carried out because of patient refusal: Secondary | ICD-10-CM | POA: Diagnosis not present

## 2023-07-06 DIAGNOSIS — J302 Other seasonal allergic rhinitis: Secondary | ICD-10-CM

## 2023-07-06 DIAGNOSIS — Z0000 Encounter for general adult medical examination without abnormal findings: Secondary | ICD-10-CM

## 2023-07-06 DIAGNOSIS — M25512 Pain in left shoulder: Secondary | ICD-10-CM

## 2023-07-06 DIAGNOSIS — I700 Atherosclerosis of aorta: Secondary | ICD-10-CM

## 2023-07-06 DIAGNOSIS — M5126 Other intervertebral disc displacement, lumbar region: Secondary | ICD-10-CM

## 2023-07-06 NOTE — Progress Notes (Signed)
Subjective:   Katherine Mccormick is a 89 y.o. female who presents for Medicare Annual (Subsequent) preventive examination.  Visit Complete: In person    Review of Systems     Cardiac Risk Factors include: advanced age (>30men, >68 women)     Objective:    Today's Vitals   07/06/23 1059  BP: 132/80  Pulse: 80  Temp: 98.2 F (36.8 C)  TempSrc: Oral  SpO2: 97%  Weight: 170 lb 9.6 oz (77.4 kg)  Height: 5\' 4"  (1.626 m)  PainSc: 4    Body mass index is 29.28 kg/m.     07/06/2023   11:05 AM 02/20/2022    9:55 AM 02/14/2021    9:44 AM 06/02/2019    9:39 AM 08/19/2017   10:23 AM 02/22/2012    2:10 AM  Advanced Directives  Does Patient Have a Medical Advance Directive? Yes Yes No No No Patient does not have advance directive;Patient would not like information  Type of Public librarian Power of [place];Living will Living will      Does patient want to make changes to medical advance directive?  No - Patient declined      Copy of Healthcare Power of Attorney in Chart? No - copy requested       Would patient like information on creating a medical advance directive?   Yes (ED - Information included in AVS) No - Patient declined Yes (MAU/Ambulatory/Procedural Areas - Information given)   Pre-existing out of facility DNR order (yellow form or pink MOST form)      No    Current Medications (verified) Outpatient Encounter Medications as of 07/06/2023  Medication Sig   acetaminophen (TYLENOL) 500 MG tablet Take 1,000 mg by mouth every 6 (six) hours as needed for moderate pain or headache.   Ascorbic Acid (VITAMIN C WITH ROSE HIPS) 500 MG tablet Take 500 mg by mouth daily.   COD LIVER OIL PO Take 1 capsule by mouth daily.   latanoprost (XALATAN) 0.005 % ophthalmic solution Place 1 drop into both eyes at bedtime.   HYDROcodone-acetaminophen (NORCO) 5-325 MG tablet Take 1 tablet by mouth every 4 (four) hours as needed for moderate pain. (Patient not taking: Reported on 06/03/2022)    meloxicam (MOBIC) 15 MG tablet Take 1 tablet (15 mg total) by mouth daily. (Patient not taking: Reported on 07/06/2023)   ondansetron (ZOFRAN-ODT) 8 MG disintegrating tablet Take 1 tablet (8 mg total) by mouth every 8 (eight) hours as needed for nausea or vomiting. (Patient not taking: Reported on 07/06/2023)   predniSONE (STERAPRED UNI-PAK 21 TAB) 10 MG (21) TBPK tablet Take as per manufacturer's recommendations (Patient not taking: Reported on 06/03/2022)   traMADol (ULTRAM) 50 MG tablet Take 50 mg by mouth every 6 (six) hours as needed for moderate pain. (Patient not taking: Reported on 07/06/2023)   No facility-administered encounter medications on file as of 07/06/2023.    Allergies (verified) Codeine, Decadron [dexamethasone], and Latex   History: History reviewed. No pertinent past medical history. Past Surgical History:  Procedure Laterality Date   ABDOMINAL HYSTERECTOMY     BACK SURGERY     History reviewed. No pertinent family history. Social History   Socioeconomic History   Marital status: Married    Spouse name: Not on file   Number of children: Not on file   Years of education: Not on file   Highest education level: Not on file  Occupational History   Not on file  Tobacco Use  Smoking status: Never   Smokeless tobacco: Never  Vaping Use   Vaping status: Never Used  Substance and Sexual Activity   Alcohol use: No   Drug use: No   Sexual activity: Not Currently  Other Topics Concern   Not on file  Social History Narrative   ** Merged History Encounter **       Social Determinants of Health   Financial Resource Strain: Low Risk  (07/06/2023)   Overall Financial Resource Strain (CARDIA)    Difficulty of Paying Living Expenses: Not hard at all  Food Insecurity: No Food Insecurity (07/06/2023)   Hunger Vital Sign    Worried About Running Out of Food in the Last Year: Never true    Ran Out of Food in the Last Year: Never true  Transportation Needs: No  Transportation Needs (07/06/2023)   PRAPARE - Administrator, Civil Service (Medical): No    Lack of Transportation (Non-Medical): No  Physical Activity: Insufficiently Active (07/06/2023)   Exercise Vital Sign    Days of Exercise per Week: 7 days    Minutes of Exercise per Session: 20 min  Stress: Stress Concern Present (07/06/2023)   [employer] of Occupational Health - Occupational Stress Questionnaire    Feeling of Stress : To some extent  Social Connections: Moderately Isolated (07/06/2023)   Social Connection and Isolation Panel [NHANES]    Frequency of Communication with Friends and Family: More than three times a week    Frequency of Social Gatherings with Friends and Family: Once a week    Attends Religious Services: Never    Database administrator or Organizations: No    Attends Engineer, structural: Never    Marital Status: Married    Tobacco Counseling Counseling given: Not Answered   Clinical Intake:  Pre-visit preparation completed: Yes  Pain : 0-10 Pain Score: 4  Pain Type: Chronic pain Pain Location: Arm Pain Orientation: Left Pain Descriptors / Indicators: Sore Pain Onset: More than a month ago Pain Frequency: Constant     Nutritional Status: BMI 25 -29 Overweight Nutritional Risks: None Diabetes: No  How often do you need to have someone help you when you read instructions, pamphlets, or other written materials from your doctor or pharmacy?: 1 - Never  Interpreter Needed?: No  Information entered by :: [employer] N   Activities of Daily Living    07/06/2023   11:00 AM  In your present state of health, do you have any difficulty performing the following activities:  Hearing? 0  Vision? 1  Comment has a cataract  Difficulty concentrating or making decisions? 0  Walking or climbing stairs? 1  Comment due to knee  Dressing or bathing? 0  Doing errands, shopping? 0  Preparing Food and eating ? N  Using the Toilet? N   In the past six months, have you accidently leaked urine? Y  Comment nothing new  Do you have problems with loss of bowel control? N  Managing your Medications? N  Managing your Finances? N  Housekeeping or managing your Housekeeping? N    Patient Care Team: [name], MD as PCP - General (Family Medicine) Antony Contras, MD as Consulting Physician (Ophthalmology)  Indicate any recent Medical Services you may have received from other than [facility] providers in the past year (date may be approximate).     Assessment:   This is a routine wellness examination for Erin.  Hearing/Vision screen Hearing Screening - Comments:: Denies  hearing issues Vision Screening - Comments:: Regular eye exams, Dr. Randon Goldsmith   Goals Addressed             This Visit's Progress    Patient Stated       07/06/2023, get over shoulder pain       Depression Screen    07/06/2023   11:07 AM 02/20/2022    9:56 AM 02/14/2021    9:31 AM 02/13/2021   12:52 PM 06/02/2019    8:48 AM 02/09/2019    9:10 AM 08/19/2017   10:10 AM  PHQ 2/9 Scores  PHQ - 2 Score 0 0 0 0 0 0 0  PHQ- 9 Score 1          Fall Risk    07/06/2023   11:07 AM 02/20/2022    9:55 AM 02/14/2021    9:31 AM 02/13/2021   12:52 PM 06/02/2019    8:49 AM  Fall Risk   Falls in the past year? 0 0 0 0 0  Number falls in past yr: 0 0 0 0   Injury with Fall? 0 0 0 0   Risk for fall due to : Medication side effect No Fall Risks No Fall Risks No Fall Risks   Follow up Falls prevention discussed;Falls evaluation completed Falls evaluation completed Falls evaluation completed Falls evaluation completed     MEDICARE RISK AT HOME: Medicare Risk at Home Any stairs in or around the home?: Yes (has a ramp) If so, are there any without handrails?: No Home free of loose throw rugs in walkways, pet beds, electrical cords, etc?: Yes Adequate lighting in your home to reduce risk of falls?: Yes Life alert?: No Use of a cane, walker or w/c?: No Grab bars  in the bathroom?: Yes Shower chair or bench in shower?: Yes Elevated toilet seat or a handicapped toilet?: Yes  TIMED UP AND GO:  Was the test performed?  Yes  Length of time to ambulate 10 feet: 5 sec Gait steady and fast without use of assistive device    Cognitive Function:        07/06/2023   11:08 AM 02/20/2022    9:58 AM  6CIT Screen  What Year? 0 points 0 points  What month? 0 points 0 points  What time? 0 points 0 points  Count back from 20 0 points 0 points  Months in reverse 2 points 0 points  Repeat phrase 4 points 0 points  Total Score 6 points 0 points    Immunizations Immunization History  Administered Date(s) Administered   PFIZER(Purple Top)SARS-COV-2 Vaccination 12/22/2019, 01/16/2020, 09/05/2020   Tdap 01/18/2012    TDAP status: Up to date  Flu Vaccine status: Declined, Education has been provided regarding the importance of this vaccine but patient still declined. Advised may receive this vaccine at local pharmacy or Health Dept. Aware to provide a copy of the vaccination record if obtained from local pharmacy or Health Dept. Verbalized acceptance and understanding.  Pneumococcal vaccine status: Declined,  Education has been provided regarding the importance of this vaccine but patient still declined. Advised may receive this vaccine at local pharmacy or Health Dept. Aware to provide a copy of the vaccination record if obtained from local pharmacy or Health Dept. Verbalized acceptance and understanding.   Covid-19 vaccine status: Information provided on how to obtain vaccines.   Qualifies for Shingles Vaccine? Yes   Zostavax completed No   Shingrix Completed?: No.    Education has been provided regarding the  importance of this vaccine. Patient has been advised to call insurance company to determine out of pocket expense if they have not yet received this vaccine. Advised may also receive vaccine at local pharmacy or Health Dept. Verbalized acceptance and  understanding.  Screening Tests Health Maintenance  Topic Date Due   DTaP/Tdap/Td (2 - Td or Tdap) 01/17/2022   COVID-19 Vaccine (4 - 2023-24 season) 06/27/2023   INFLUENZA VACCINE  01/24/2024 (Originally 05/27/2023)   Zoster Vaccines- Shingrix (1 of 2) 05/22/2024 (Originally 04/18/1953)   Pneumonia Vaccine 27+ Years old (1 of 1 - PCV) 07/05/2024 (Originally 04/17/2001)   Medicare Annual Wellness (AWV)  07/05/2024   DEXA SCAN  Completed   HPV VACCINES  Aged Out    Health Maintenance  Health Maintenance Due  Topic Date Due   DTaP/Tdap/Td (2 - Td or Tdap) 01/17/2022   COVID-19 Vaccine (4 - 2023-24 season) 06/27/2023    Colorectal cancer screening: No longer required.   Mammogram status: No longer required due to age.  Bone Density status: Completed 10/15/2022.   Lung Cancer Screening: (Low Dose CT Chest recommended if Age 41-80 years, 20 pack-year currently smoking OR have quit w/in 15years.) does not qualify.   Lung Cancer Screening Referral: no  Additional Screening:  Hepatitis C Screening: does not qualify;   Vision Screening: Recommended annual ophthalmology exams for early detection of glaucoma and other disorders of the eye. Is the patient up to date with their annual eye exam?  Yes  Who is the provider or what is the name of the office in which the patient attends annual eye exams? Dr. Randon Goldsmith If pt is not established with a provider, would they like to be referred to a provider to establish care? No .   Dental Screening: Recommended annual dental exams for proper oral hygiene  Diabetic Foot Exam: n/a  Community Resource Referral / Chronic Care Management: CRR required this visit?  No   CCM required this visit?  No     Plan:     I have personally reviewed and noted the following in the patient's chart:   Medical and social history Use of alcohol, tobacco or illicit drugs  Current medications and supplements including opioid prescriptions. Patient is not  currently taking opioid prescriptions. Functional ability and status Nutritional status Physical activity Advanced directives List of other physicians Hospitalizations, surgeries, and ER visits in previous 12 months Vitals Screenings to include cognitive, depression, and falls Referrals and appointments  In addition, I have reviewed and discussed with patient certain preventive protocols, quality metrics, and best practice recommendations. A written personalized care plan for preventive services as well as general preventive health recommendations were provided to patient.     Barb Merino, LPN   4/[id number]/[id number]   After Visit Summary: in person  Nurse Notes: none

## 2023-07-06 NOTE — Patient Instructions (Signed)
Take a good multivitamin and extra calcium [place]

## 2023-07-06 NOTE — Progress Notes (Signed)
   Subjective:    Patient ID: Katherine Mccormick, female    DOB: 1934-10-01, 89 y.o.   MRN: [id number]  HPI She is here for an annual wellness visit.  Her main complaint today is left shoulder that she has had for the last 2 months.  No history of injury or overuse.  She also has a lesion on her right shoulder that is causing some itching but she states that she has had this for several years.  She also has had intermittent difficulty with urinary symptoms describing both overactive as well as difficulty with hesitancy.  She does have a history of osteopenia but has not started on any medications.  She also has evidence of aortic atherosclerosis but is not interested in any intervention there.  She does see ophthalmology regularly.  She does have some arthritic type symptoms however these usually go away with minimal physical activity.   Review of Systems  All other systems reviewed and are negative.      Objective:    Physical Exam Alert and in no distress. Tympanic membranes and canals are normal. Pharyngeal area is normal. Neck is supple without adenopathy or thyromegaly. Cardiac exam shows a regular sinus rhythm without murmurs or gallops. Lungs are clear to auscultation. Left shoulder exam shows decreased range of motion in all directions.  No laxity.  Negative sulcus sign.      Assessment & Plan:   Problem List Items Addressed This Visit     Allergic rhinitis   Relevant Orders   CBC with Differential/Platelet   Comprehensive metabolic panel   Aortic atherosclerosis (HCC)   Protrusion of lumbar intervertebral disc - Primary   S/P cataract surgery, right   Other Visit Diagnoses     Acute pain of left shoulder       Relevant Orders   DG Shoulder Left   Immunization refused         Encouraged her to continue to take good care of her self.  I will order shoulder x-rays and follow-up pending results of that.  Discussed treatment of the aortic atherosclerosis and at this point she  is not interested.

## 2023-07-06 NOTE — Patient Instructions (Signed)
Ms. Matheney , Thank you for taking time to come for your Medicare Wellness Visit. I appreciate your ongoing commitment to your health goals. Please review the following plan we discussed and let me know if I can assist you in the future.   Referrals/Orders/Follow-Ups/Clinician Recommendations: none  This is a list of the screening recommended for you and due dates:  Health Maintenance  Topic Date Due   DTaP/Tdap/Td vaccine (2 - Td or Tdap) 01/17/2022   COVID-19 Vaccine (4 - 2023-24 season) 06/27/2023   Flu Shot  01/24/2024*   Zoster (Shingles) Vaccine (1 of 2) 05/22/2024*   Pneumonia Vaccine (1 of 1 - PCV) 07/05/2024*   Medicare Annual Wellness Visit  07/05/2024   DEXA scan (bone density measurement)  Completed   HPV Vaccine  Aged Out  *Topic was postponed. The date shown is not the original due date.    Advanced directives: (Copy Requested) Please bring a copy of your health care power of attorney and living will to the office to be added to your chart at your convenience.  Next Medicare Annual Wellness Visit scheduled for next year: Yes  Insert Preventive Care attachment Insert FALL PREVENTION attachment if needed

## 2023-07-07 LAB — CBC WITH DIFFERENTIAL/PLATELET
Basophils Absolute: 0.1 10*3/uL (ref 0.0–0.2)
Basos: 1 %
EOS (ABSOLUTE): 0.2 10*3/uL (ref 0.0–0.4)
Eos: 4 %
Hematocrit: 42.3 % (ref 34.0–46.6)
Hemoglobin: 13.2 g/dL (ref 11.1–15.9)
Immature Grans (Abs): 0 10*3/uL (ref 0.0–0.1)
Immature Granulocytes: 0 %
Lymphocytes Absolute: 0.9 10*3/uL (ref 0.7–3.1)
Lymphs: 14 %
MCH: 26.5 pg — ABNORMAL LOW (ref 26.6–33.0)
MCHC: 31.2 g/dL — ABNORMAL LOW (ref 31.5–35.7)
MCV: 85 fL (ref 79–97)
Monocytes Absolute: 0.8 10*3/uL (ref 0.1–0.9)
Monocytes: 11 %
Neutrophils Absolute: 4.6 10*3/uL (ref 1.4–7.0)
Neutrophils: 70 %
Platelets: 249 10*3/uL (ref 150–450)
RBC: 4.98 x10E6/uL (ref 3.77–5.28)
RDW: 14.4 % (ref 11.7–15.4)
WBC: 6.7 10*3/uL (ref 3.4–10.8)

## 2023-07-07 LAB — COMPREHENSIVE METABOLIC PANEL
ALT: 11 IU/L (ref 0–32)
AST: 15 IU/L (ref 0–40)
Albumin: 4.1 g/dL (ref 3.7–4.7)
Alkaline Phosphatase: 96 IU/L (ref 44–121)
BUN/Creatinine Ratio: 15 (ref 12–28)
BUN: 13 mg/dL (ref 8–27)
Bilirubin Total: 0.3 mg/dL (ref 0.0–1.2)
CO2: 24 mmol/L (ref 20–29)
Calcium: 9.4 mg/dL (ref 8.7–10.3)
Chloride: 106 mmol/L (ref 96–106)
Creatinine, Ser: 0.89 mg/dL (ref 0.57–1.00)
Globulin, Total: 3.3 g/dL (ref 1.5–4.5)
Glucose: 103 mg/dL — ABNORMAL HIGH (ref 70–99)
Potassium: 4.2 mmol/L (ref 3.5–5.2)
Sodium: 144 mmol/L (ref 134–144)
Total Protein: 7.4 g/dL (ref 6.0–8.5)
eGFR: 63 mL/min/{1.73_m2} (ref >59)

## 2024-01-12 DIAGNOSIS — H2512 Age-related nuclear cataract, left eye: Secondary | ICD-10-CM | POA: Diagnosis not present

## 2024-01-12 DIAGNOSIS — H401131 Primary open-angle glaucoma, bilateral, mild stage: Secondary | ICD-10-CM | POA: Diagnosis not present

## 2024-02-07 ENCOUNTER — Ambulatory Visit: Payer: Self-pay

## 2024-02-07 NOTE — Telephone Encounter (Signed)
 Copied from CRM [id number]. Topic: Clinical - Red Word Triage >> Feb 07, 2024  2:41 PM Chuck Crater wrote: Red Word that prompted transfer to Nurse Triage: Patient has severe pain in her neck. Pain is going down to her shoulders and it is hard to move around.  Chief Complaint: neck pain that goes to shoulders and to chest Symptoms: see above Frequency: got worse yesterday Pertinent Negatives: Patient denies cp, sob, numbness, tingling, headache Disposition: [] ED /[] Urgent Care (no appt availability in office) / [x] Appointment(In office/virtual)/ []  [facility]/ [] Home Care/ [] Refused Recommended Disposition /[] [facility]/ []  Follow-up with PCP Additional Notes: per protocol apt made for tomorrow; care advice given, denies questions; instructed to go to ER if becomes worse.   Reason for Disposition  [1] SEVERE neck pain (e.g., excruciating, unable to do any normal activities) AND [2] not improved after 2 hours of pain medicine  Answer Assessment - Initial Assessment Questions 1. ONSET: "When did the pain begin?"      Worse yesterday 2. LOCATION: "Where does it hurt?"      Neck pain goes and down in to shoulders and into chest on right side. 3. PATTERN "Does the pain come and go, or has it been constant since it started?"      constant 4. SEVERITY: "How bad is the pain?"  (Scale 1-10; or mild, moderate, severe)   - NO PAIN (0): no pain or only slight stiffness    - MILD (1-3): doesn't interfere with normal activities    - MODERATE (4-7): interferes with normal activities or awakens from sleep    - SEVERE (8-10):  excruciating pain, unable to do any normal activities      severe 5. RADIATION: "Does the pain go anywhere else, shoot into your arms?"     Shoulders to chest 6. CORD SYMPTOMS: "Any weakness or numbness of the arms or legs?"     denies 7. CAUSE: "What do you think is causing the neck pain?"     unknown 8. NECK OVERUSE: "Any recent activities that involved  turning or twisting the neck?"     no 9. OTHER SYMPTOMS: "Do you have any other symptoms?" (e.g., headache, fever, chest pain, difficulty breathing, neck swelling)     denies 10. PREGNANCY: "Is there any chance you are pregnant?" "When was your last menstrual period?"       na  Protocols used: Neck Pain or Stiffness-A-AH

## 2024-02-08 ENCOUNTER — Ambulatory Visit (INDEPENDENT_AMBULATORY_CARE_PROVIDER_SITE_OTHER): Admitting: Family Medicine

## 2024-02-08 ENCOUNTER — Encounter: Payer: Self-pay | Admitting: Family Medicine

## 2024-02-08 VITALS — BP 136/80 | HR 87 | Wt 175.0 lb

## 2024-02-08 DIAGNOSIS — M542 Cervicalgia: Secondary | ICD-10-CM

## 2024-02-08 MED ORDER — CARISOPRODOL 350 MG PO TABS: 350.0000 mg | ORAL_TABLET | Freq: Three times a day (TID) | ORAL | 0 refills | Status: AC | PRN

## 2024-02-08 NOTE — Patient Instructions (Addendum)
 heat for half an hour 3 times per day to your neck.  Do some gentle stretching after that.  Take 2 Aleve twice per day.  Use the muscle relaxer as needed but especially at night.  If you keep having trouble call me

## 2024-02-08 NOTE — Progress Notes (Signed)
   Acute Office Visit  Subjective:     Patient ID: Katherine Mccormick, female    DOB: 07-13-1934, 89 y.o.   MRN: [id number]  Chief Complaint  Patient presents with   Acute Visit    Acute visit. Neck pain. Neck pain travels up to head.     HPI She states that she woke up Saturday with neck pain that was over her entire neck area.  No numbness tingling or weakness down either arm.  She has not had difficulty with this in the past. ROS      Objective:    BP 136/80   Pulse 87   Wt 175 lb (79.4 kg)   SpO2 95%   BMI 30.04 kg/m    Physical Exam  Alert and complaining of neck pain.  Palpable tenderness over the entire posterior cervical area with no point tenderness.  Normal motor, sensory and DTRs.     Assessment & Plan:   Neck pain - Plan: carisoprodol (SOMA) 350 MG tablet    Meds ordered this encounter  Medications   carisoprodol (SOMA) 350 MG tablet    Sig: Take 1 tablet (350 mg total) by mouth 3 (three) times daily as needed for muscle spasms.    Dispense:  20 tablet    Refill:  0   heat for half an hour 3 times per day to your neck.  Do some gentle stretching after that.  Take 2 Aleve twice per day.  Use the muscle relaxer as needed but especially at night.  If you keep having trouble call me   Ron Cobbs, MD

## 2024-02-09 ENCOUNTER — Other Ambulatory Visit: Payer: Self-pay | Admitting: Family Medicine

## 2024-02-09 ENCOUNTER — Ambulatory Visit: Admitting: Family Medicine

## 2024-02-09 DIAGNOSIS — M542 Cervicalgia: Secondary | ICD-10-CM

## 2024-02-09 NOTE — Telephone Encounter (Signed)
 Pharmacy requesting alternative to fill due to stock   Copied from CRM [id number]. Topic: Clinical - Prescription Issue >> Feb 09, 2024  3:38 PM Antwanette L wrote: Reason for CRM: Patient is calling in because [facility] is out carisoprodol (SOMA) 350 MG tablet and they are requesting an alternative. Patient can be contacted by phone at [phone].  Preferred Pharmacy  [facility] [address] [place], [place] - [address] [place] [address] Phone: [phone] Fax: [phone]

## 2024-02-10 NOTE — Telephone Encounter (Signed)
Tried to call patient but phone just rang and rang 

## 2024-02-10 NOTE — Telephone Encounter (Signed)
 I have pended order to correct pharmacy. Please send as I can not send controlled meds

## 2024-02-10 NOTE — Addendum Note (Signed)
 Addended by: Charliene Conte A on: 02/10/2024 10:11 AM   Modules accepted: Orders

## 2024-07-19 DIAGNOSIS — H25012 Cortical age-related cataract, left eye: Secondary | ICD-10-CM | POA: Diagnosis not present

## 2024-07-19 DIAGNOSIS — H2512 Age-related nuclear cataract, left eye: Secondary | ICD-10-CM | POA: Diagnosis not present

## 2024-07-19 DIAGNOSIS — H401131 Primary open-angle glaucoma, bilateral, mild stage: Secondary | ICD-10-CM | POA: Diagnosis not present

## 2024-08-08 ENCOUNTER — Ambulatory Visit
